# Patient Record
Sex: Male | Born: 1995
Health system: Southern US, Community
[De-identification: ages and names within clinical notes are randomized; demographics above are authoritative.]

## PROBLEM LIST (undated history)

## (undated) DIAGNOSIS — E669 Obesity, unspecified: Secondary | ICD-10-CM

## (undated) DIAGNOSIS — K219 Gastro-esophageal reflux disease without esophagitis: Secondary | ICD-10-CM

## (undated) DIAGNOSIS — T7840XA Allergy, unspecified, initial encounter: Secondary | ICD-10-CM

## (undated) DIAGNOSIS — F909 Attention-deficit hyperactivity disorder, unspecified type: Secondary | ICD-10-CM

## (undated) HISTORY — DX: Allergy, unspecified, initial encounter: T78.40XA

## (undated) HISTORY — DX: Obesity, unspecified: E66.9

## (undated) HISTORY — DX: Attention-deficit hyperactivity disorder, unspecified type: F90.9

---
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
## 2016-01-28 ENCOUNTER — Ambulatory Visit (INDEPENDENT_AMBULATORY_CARE_PROVIDER_SITE_OTHER): Payer: BLUE CROSS/BLUE SHIELD | Admitting: Family Medicine

## 2016-01-28 ENCOUNTER — Encounter: Payer: Self-pay | Admitting: Family Medicine

## 2016-01-28 VITALS — BP 140/100 | HR 80 | Temp 98.2°F | Resp 16 | Wt 220.0 lb

## 2016-01-28 DIAGNOSIS — J069 Acute upper respiratory infection, unspecified: Secondary | ICD-10-CM | POA: Diagnosis not present

## 2016-01-28 DIAGNOSIS — J4521 Mild intermittent asthma with (acute) exacerbation: Secondary | ICD-10-CM | POA: Diagnosis not present

## 2016-01-28 MED ORDER — PREDNISONE 10 MG (21) PO TBPK: 10.0000 mg | ORAL_TABLET | Freq: Every day | ORAL | 0 refills | Status: DC

## 2016-01-28 MED ORDER — ALBUTEROL SULFATE (2.5 MG/3ML) 0.083% IN NEBU
2.5000 mg | INHALATION_SOLUTION | Freq: Once | RESPIRATORY_TRACT | Status: AC
  Administered 2016-01-28: 2.5 mg via RESPIRATORY_TRACT

## 2016-01-28 NOTE — Patient Instructions (Addendum)
Start the steroid dose pak and use albuterol as prescribed. Call if you are not improving or if you get worse or spike a fever.

## 2016-01-28 NOTE — Progress Notes (Signed)
   Subjective:    Patient ID: Daniel Church, male    DOB: 03-01-1995, 20 y.o.   MRN: [id number]  HPI Chief Complaint  Patient presents with  . sick    sick- coughing chest congestion. thrown up 3 times yesterday. using neb every 6 hours and used inhaler about 10 times yesterday.   He is a [age] year male with a history of mild intermittent asthma who is here with complaints of a 2 day history of rhinorrhea, nasal congestion, coughing, wheezing and chest tightness. States he used his nebulizer 4 times yesterday with the last time at 3am. Reports he used his proair approximately 10 times. States he is at least 40% improved today compared to yesterday. States he has a peak flow meter at home but did not use it. States his asthma is normally well controlled.  States he vomited 3 times after coughing and states just mucous came up.   Reports having asthma since childhood. States his asthma triggers are usually with exercise or URI. No history of ED visits for asthma since childhood and no hospitalizations. Uses inhaler prior to band practice when he is going to be out in the cold for hours.   Denies fever, chills, ear pain, sore throat, abdominal pain, nausea, vomiting, diarrhea.   Reviewed allergies, medications, past medical, and social history.    Review of Systems Pertinent positives and negatives in the history of present illness.     Objective:   Physical Exam BP (!) 140/100   Pulse 80   Temp 98.2 F (36.8 C) (Oral)   Resp 16   Wt 220 lb (99.8 kg)   SpO2 98%   BMI 34.46 kg/m  Alert and in no distress. No sinus tenderness. Nares with erythema, edema and clear drainage. Tympanic membranes and canals are normal. Pharyngeal area is normal. Neck is supple without adenopathy or thyromegaly. Cardiac exam shows a regular sinus rhythm without murmurs or gallops. Lungs with expiratory wheezing throughout prior to breathing treatment. No accessory muscle use or tachypnea.  Post  breathing treatment: Left lobes with mild expiratory wheezing, right lobes with mild to moderate expiratory wheezing. Some relief reported per patient.  Peak flow improved slightly after breathing treatment from 320 to 330.       Assessment & Plan:  Exacerbation of intermittent asthma, unspecified asthma severity  Acute URI  Discussed that his asthma appears well controlled and that he does have an acute URI that seems to be flaring his asthma. Plan to treat with course of steroids. Discussed that he appears to have a viral URI and does not need antibiotics at this time. Reeducated patient on peak flow meter being aware of asthma triggers. He is quite knowledgeable about his illness. He will follow-up if he worsens or is not back to baseline after completing the course of steroids.

## 2016-12-09 ENCOUNTER — Observation Stay (HOSPITAL_BASED_OUTPATIENT_CLINIC_OR_DEPARTMENT_OTHER): Payer: BLUE CROSS/BLUE SHIELD

## 2016-12-09 ENCOUNTER — Encounter (HOSPITAL_COMMUNITY): Payer: Self-pay | Admitting: Emergency Medicine

## 2016-12-09 ENCOUNTER — Emergency Department (HOSPITAL_COMMUNITY): Payer: BLUE CROSS/BLUE SHIELD

## 2016-12-09 ENCOUNTER — Inpatient Hospital Stay (HOSPITAL_COMMUNITY)
Admission: EM | Admit: 2016-12-09 | Discharge: 2016-12-11 | DRG: 203 | Disposition: A | Discharge status: Home / Self Care | Payer: BLUE CROSS/BLUE SHIELD | Attending: Internal Medicine | Admitting: Internal Medicine

## 2016-12-09 ENCOUNTER — Encounter (HOSPITAL_COMMUNITY): Payer: Self-pay

## 2016-12-09 ENCOUNTER — Emergency Department (HOSPITAL_COMMUNITY)
Admission: EM | Admit: 2016-12-09 | Discharge: 2016-12-09 | Disposition: A | Discharge status: Home / Self Care | Payer: BLUE CROSS/BLUE SHIELD | Source: Home / Self Care | Attending: Emergency Medicine | Admitting: Emergency Medicine

## 2016-12-09 DIAGNOSIS — B349 Viral infection, unspecified: Secondary | ICD-10-CM | POA: Diagnosis present

## 2016-12-09 DIAGNOSIS — E669 Obesity, unspecified: Secondary | ICD-10-CM | POA: Diagnosis present

## 2016-12-09 DIAGNOSIS — J4541 Moderate persistent asthma with (acute) exacerbation: Secondary | ICD-10-CM | POA: Insufficient documentation

## 2016-12-09 DIAGNOSIS — F909 Attention-deficit hyperactivity disorder, unspecified type: Secondary | ICD-10-CM | POA: Diagnosis present

## 2016-12-09 DIAGNOSIS — I158 Other secondary hypertension: Secondary | ICD-10-CM

## 2016-12-09 DIAGNOSIS — J45901 Unspecified asthma with (acute) exacerbation: Secondary | ICD-10-CM | POA: Diagnosis not present

## 2016-12-09 DIAGNOSIS — R9431 Abnormal electrocardiogram [ECG] [EKG]: Secondary | ICD-10-CM | POA: Diagnosis present

## 2016-12-09 DIAGNOSIS — Z720 Tobacco use: Secondary | ICD-10-CM | POA: Diagnosis present

## 2016-12-09 DIAGNOSIS — J4551 Severe persistent asthma with (acute) exacerbation: Secondary | ICD-10-CM

## 2016-12-09 DIAGNOSIS — Z79899 Other long term (current) drug therapy: Secondary | ICD-10-CM

## 2016-12-09 DIAGNOSIS — R0602 Shortness of breath: Secondary | ICD-10-CM | POA: Diagnosis not present

## 2016-12-09 DIAGNOSIS — R000 Tachycardia, unspecified: Secondary | ICD-10-CM

## 2016-12-09 LAB — ECHOCARDIOGRAM COMPLETE
AVLVOTPG: 5 mmHg
FS: 29 % (ref 28–44)
HEIGHTINCHES: 67 in
IVS/LV PW RATIO, ED: 1.16
LA ID, A-P, ES: 26 mm
LA diam end sys: 26 mm
LA diam index: 1.22 cm/m2
LA vol A4C: 20.3 ml
LA vol index: 13.1 mL/m2
LAVOL: 27.8 mL
LV e' LATERAL: 17.7 cm/s
LVOT VTI: 19.2 cm
LVOT area: 3.8 cm2
LVOTD: 22 mm
LVOTPV: 117 cm/s
LVOTSV: 73 mL
Lateral S' vel: 18.8 cm/s
PW: 8.23 mm — AB (ref 0.6–1.1)
RV TAPSE: 22.8 mm
TDI e' lateral: 17.7
TDI e' medial: 11.7
WEIGHTICAEL: 3600 [oz_av]

## 2016-12-09 LAB — D-DIMER, QUANTITATIVE (NOT AT ARMC): D DIMER QUANT: 0.29 ug{FEU}/mL (ref 0.00–0.50)

## 2016-12-09 LAB — BASIC METABOLIC PANEL
ANION GAP: 14 (ref 5–15)
BUN: 8 mg/dL (ref 6–20)
CHLORIDE: 104 mmol/L (ref 101–111)
CO2: 20 mmol/L — ABNORMAL LOW (ref 22–32)
Calcium: 9.8 mg/dL (ref 8.9–10.3)
Creatinine, Ser: 1.18 mg/dL (ref 0.61–1.24)
Glucose, Bld: 112 mg/dL — ABNORMAL HIGH (ref 65–99)
POTASSIUM: 3.6 mmol/L (ref 3.5–5.1)
SODIUM: 138 mmol/L (ref 135–145)

## 2016-12-09 LAB — CBC WITH DIFFERENTIAL/PLATELET
BASOS ABS: 0 10*3/uL (ref 0.0–0.1)
Basophils Relative: 0 %
EOS ABS: 0.2 10*3/uL (ref 0.0–0.7)
EOS PCT: 2 %
HCT: 48.1 % (ref 39.0–52.0)
HEMOGLOBIN: 16.9 g/dL (ref 13.0–17.0)
LYMPHS ABS: 0.9 10*3/uL (ref 0.7–4.0)
Lymphocytes Relative: 8 %
MCH: 31.5 pg (ref 26.0–34.0)
MCHC: 35.1 g/dL (ref 30.0–36.0)
MCV: 89.7 fL (ref 78.0–100.0)
Monocytes Absolute: 1.4 10*3/uL — ABNORMAL HIGH (ref 0.1–1.0)
Monocytes Relative: 12 %
NEUTROS PCT: 78 %
Neutro Abs: 9.4 10*3/uL — ABNORMAL HIGH (ref 1.7–7.7)
PLATELETS: 354 10*3/uL (ref 150–400)
RBC: 5.36 MIL/uL (ref 4.22–5.81)
RDW: 12.6 % (ref 11.5–15.5)
WBC: 11.8 10*3/uL — AB (ref 4.0–10.5)

## 2016-12-09 LAB — I-STAT TROPONIN, ED: TROPONIN I, POC: 0 ng/mL (ref 0.00–0.08)

## 2016-12-09 LAB — TROPONIN I

## 2016-12-09 MED ORDER — ONDANSETRON HCL 4 MG/2ML IJ SOLN: 4.0000 mg | Freq: Four times a day (QID) | INTRAMUSCULAR | Status: DC | PRN

## 2016-12-09 MED ORDER — LEVALBUTEROL HCL 1.25 MG/0.5ML IN NEBU
1.2500 mg | INHALATION_SOLUTION | Freq: Four times a day (QID) | RESPIRATORY_TRACT | Status: DC
  Administered 2016-12-10 (×3): 1.25 mg via RESPIRATORY_TRACT
  Filled 2016-12-09 (×3): qty 0.5

## 2016-12-09 MED ORDER — ALBUTEROL SULFATE HFA 108 (90 BASE) MCG/ACT IN AERS: 1.0000 | INHALATION_SPRAY | Freq: Four times a day (QID) | RESPIRATORY_TRACT | 1 refills | Status: DC | PRN

## 2016-12-09 MED ORDER — ACETAMINOPHEN 325 MG PO TABS
650.0000 mg | ORAL_TABLET | Freq: Four times a day (QID) | ORAL | Status: DC | PRN
  Administered 2016-12-09 – 2016-12-10 (×2): 650 mg via ORAL
  Filled 2016-12-09 (×2): qty 2

## 2016-12-09 MED ORDER — IPRATROPIUM-ALBUTEROL 0.5-2.5 (3) MG/3ML IN SOLN
3.0000 mL | Freq: Four times a day (QID) | RESPIRATORY_TRACT | Status: DC
Start: 2016-12-09 — End: 2016-12-09

## 2016-12-09 MED ORDER — METHYLPREDNISOLONE SODIUM SUCC 125 MG IJ SOLR
60.0000 mg | Freq: Four times a day (QID) | INTRAMUSCULAR | Status: DC
  Administered 2016-12-09 – 2016-12-11 (×8): 60 mg via INTRAVENOUS
  Filled 2016-12-09 (×8): qty 2

## 2016-12-09 MED ORDER — ALPRAZOLAM 0.25 MG PO TABS
0.2500 mg | ORAL_TABLET | Freq: Three times a day (TID) | ORAL | Status: DC | PRN
  Administered 2016-12-10: 0.25 mg via ORAL
  Filled 2016-12-09: qty 1

## 2016-12-09 MED ORDER — ONDANSETRON HCL 4 MG PO TABS: 4.0000 mg | ORAL_TABLET | Freq: Four times a day (QID) | ORAL | Status: DC | PRN

## 2016-12-09 MED ORDER — ACETAMINOPHEN 650 MG RE SUPP: 650.0000 mg | Freq: Four times a day (QID) | RECTAL | Status: DC | PRN

## 2016-12-09 MED ORDER — SODIUM CHLORIDE 0.9% FLUSH
3.0000 mL | Freq: Two times a day (BID) | INTRAVENOUS | Status: DC
  Administered 2016-12-09 – 2016-12-11 (×3): 3 mL via INTRAVENOUS

## 2016-12-09 MED ORDER — LEVALBUTEROL HCL 1.25 MG/0.5ML IN NEBU
1.2500 mg | INHALATION_SOLUTION | Freq: Four times a day (QID) | RESPIRATORY_TRACT | Status: DC
  Administered 2016-12-09 (×2): 1.25 mg via RESPIRATORY_TRACT
  Filled 2016-12-09 (×4): qty 0.5

## 2016-12-09 MED ORDER — HYDRALAZINE HCL 20 MG/ML IJ SOLN
5.0000 mg | INTRAMUSCULAR | Status: DC | PRN
  Filled 2016-12-09: qty 1

## 2016-12-09 MED ORDER — MAGNESIUM SULFATE 2 GM/50ML IV SOLN
2.0000 g | Freq: Once | INTRAVENOUS | Status: AC
  Administered 2016-12-09: 2 g via INTRAVENOUS
  Filled 2016-12-09: qty 50

## 2016-12-09 MED ORDER — ALBUTEROL SULFATE (2.5 MG/3ML) 0.083% IN NEBU: 2.5000 mg | INHALATION_SOLUTION | RESPIRATORY_TRACT | 0 refills | Status: DC | PRN

## 2016-12-09 MED ORDER — METHYLPREDNISOLONE SODIUM SUCC 125 MG IJ SOLR
125.0000 mg | Freq: Once | INTRAMUSCULAR | Status: AC
  Administered 2016-12-09: 125 mg via INTRAVENOUS
  Filled 2016-12-09: qty 2

## 2016-12-09 MED ORDER — ALBUTEROL SULFATE (2.5 MG/3ML) 0.083% IN NEBU: 5.0000 mg | INHALATION_SOLUTION | Freq: Once | RESPIRATORY_TRACT | Status: AC

## 2016-12-09 MED ORDER — IPRATROPIUM-ALBUTEROL 0.5-2.5 (3) MG/3ML IN SOLN
3.0000 mL | Freq: Once | RESPIRATORY_TRACT | Status: AC
  Administered 2016-12-09: 3 mL via RESPIRATORY_TRACT
  Filled 2016-12-09: qty 3

## 2016-12-09 MED ORDER — PREDNISONE 20 MG PO TABS: 60.0000 mg | ORAL_TABLET | Freq: Once | ORAL | Status: DC

## 2016-12-09 MED ORDER — IPRATROPIUM BROMIDE 0.02 % IN SOLN
0.5000 mg | Freq: Once | RESPIRATORY_TRACT | Status: AC
  Administered 2016-12-09: 0.5 mg via RESPIRATORY_TRACT

## 2016-12-09 MED ORDER — LORAZEPAM 2 MG/ML IJ SOLN
1.0000 mg | Freq: Once | INTRAMUSCULAR | Status: AC
  Administered 2016-12-09: 1 mg via INTRAVENOUS
  Filled 2016-12-09: qty 1

## 2016-12-09 MED ORDER — ENOXAPARIN SODIUM 40 MG/0.4ML ~~LOC~~ SOLN
40.0000 mg | SUBCUTANEOUS | Status: DC
  Administered 2016-12-09 – 2016-12-10 (×2): 40 mg via SUBCUTANEOUS
  Filled 2016-12-09 (×2): qty 0.4

## 2016-12-09 MED ORDER — SODIUM CHLORIDE 0.9 % IV SOLN
INTRAVENOUS | Status: DC
  Administered 2016-12-09 – 2016-12-10 (×3): via INTRAVENOUS

## 2016-12-09 MED ORDER — ALBUTEROL (5 MG/ML) CONTINUOUS INHALATION SOLN
15.0000 mg | INHALATION_SOLUTION | RESPIRATORY_TRACT | Status: DC
  Administered 2016-12-09: 15 mg via RESPIRATORY_TRACT

## 2016-12-09 MED ORDER — IPRATROPIUM-ALBUTEROL 0.5-2.5 (3) MG/3ML IN SOLN
3.0000 mL | RESPIRATORY_TRACT | Status: DC | PRN
  Administered 2016-12-11 (×2): 3 mL via RESPIRATORY_TRACT
  Filled 2016-12-09 (×3): qty 3

## 2016-12-09 MED ORDER — PREDNISONE 50 MG PO TABS: ORAL_TABLET | ORAL | 0 refills | Status: DC

## 2016-12-09 MED ORDER — METOPROLOL TARTRATE 5 MG/5ML IV SOLN: 2.5000 mg | INTRAVENOUS | Status: DC | PRN

## 2016-12-09 MED ORDER — ALBUTEROL SULFATE (2.5 MG/3ML) 0.083% IN NEBU
INHALATION_SOLUTION | RESPIRATORY_TRACT | Status: AC
  Filled 2016-12-09: qty 3

## 2016-12-09 NOTE — ED Triage Notes (Signed)
Pt having sob, Hx of asthma not getting relief with rescue inhaler at home for the past hour.

## 2016-12-09 NOTE — ED Provider Notes (Signed)
[facility] EMERGENCY DEPARTMENT Provider Note   CSN: [id number] Arrival date & time: 12/09/16  7829     History   Chief Complaint Chief Complaint  Patient presents with  . Shortness of Breath    HPI Daniel Church is a 21 y.o. male.  The history is provided by the patient and a parent.  Shortness of Breath  This is a new problem. The problem occurs frequently.The current episode started yesterday. The problem has been gradually worsening. Associated symptoms include cough, sputum production and wheezing. Pertinent negatives include no fever, no hemoptysis, no vomiting, no leg pain and no leg swelling. Associated symptoms comments: Chest tightness . Associated medical issues include asthma.   Patient with h/o asthma, presents with cough/shortness of breath/wheezing over past 24 hrs that is worsening Similar to prior asthma exacerbation though it has been several yrs since last attack He reports chest tightness and difficulty breathing He reports he is a smoker Past Medical History:  Diagnosis Date  . ADHD (attention deficit hyperactivity disorder)   . Allergy   . Asthma   . Obesity     Patient Active Problem List   Diagnosis Date Noted  . Vision changes 02/27/2011  . Obesity 02/27/2011  . Asthma 11/21/2010  . ADHD (attention deficit hyperactivity disorder) 11/21/2010  . Obesity (BMI 30-39.9) 11/21/2010    History reviewed. No pertinent surgical history.     Home Medications    Prior to Admission medications   Medication Sig Start Date End Date Taking? Authorizing Provider  fexofenadine (ALLEGRA) 30 MG tablet Take 30 mg by mouth 2 (two) times daily.    [provider]  predniSONE (STERAPRED UNI-PAK 21 TAB) 10 MG (21) TBPK tablet Take 1 tablet (10 mg total) by mouth daily. Tapered dose as per manufacturers recommendation. 01/28/16   Avanell Shackleton, NP-C  PROAIR HFA 108 [phone] Base) MCG/ACT inhaler inhale 2 puffs by mouth every 6  hours if needed for cough wheezing 09/24/15   Ronnald Nian, MD    Family History Family History  Problem Relation Age of Onset  . Hypertension Mother   . Diabetes Father   . Heart disease Father        CHF  . Irritable bowel syndrome Father   . Hyperlipidemia Father   . Hypertension Father   . Heart disease Maternal Uncle 47       MI  . Bipolar disorder Maternal Uncle   . Heart disease Maternal Uncle 51       MI  . Cancer Neg Hx     Social History Social History  Substance Use Topics  . Smoking status: Never Smoker  . Smokeless tobacco: Never Used  . Alcohol use No     Allergies   Patient has no known allergies.   Review of Systems Review of Systems  Constitutional: Negative for fever.  Respiratory: Positive for cough, sputum production, shortness of breath and wheezing. Negative for hemoptysis.   Cardiovascular: Negative for leg swelling.  Gastrointestinal: Negative for vomiting.  All other systems reviewed and are negative.    Physical Exam Updated Vital Signs Ht 1.702 m (5\' 7" )   Wt 102.1 kg (225 lb)   SpO2 96%   BMI 35.24 kg/m   Physical Exam CONSTITUTIONAL: Well developed/well nourished HEAD: Normocephalic/atraumatic EYES: EOMI/PERRL ENMT: Mucous membranes moist, uvula midline, no erythema/exudates NECK: supple no meningeal signs SPINE/BACK:entire spine nontender CV: S1/S2 noted, no murmurs/rubs/gallops noted LUNGS: tachypnea, wheezing bilaterally ABDOMEN: soft, nontender, no  rebound or guarding, bowel sounds noted throughout abdomen GU:no cva tenderness NEURO: Pt is awake/alert/appropriate, moves all extremitiesx4.  No facial droop.   EXTREMITIES: pulses normal/equal, full ROM, no LE edema/tenderness SKIN: warm, color normal PSYCH: no abnormalities of mood noted, alert and oriented to situation   ED Treatments / Results  Labs (all labs ordered are listed, but only abnormal results are displayed) Labs Reviewed  CBC WITH  DIFFERENTIAL/PLATELET - Abnormal; Notable for the following:       Result Value   WBC 11.8 (*)    Neutro Abs 9.4 (*)    Monocytes Absolute 1.4 (*)    All other components within normal limits  BASIC METABOLIC PANEL - Abnormal; Notable for the following:    CO2 20 (*)    Glucose, Bld 112 (*)    All other components within normal limits  I-STAT TROPONIN, ED    EKG  EKG Interpretation  Date/Time:  Wednesday December 09 2016 04:41:01 EDT Ventricular Rate:  101 PR Interval:    QRS Duration: 82 QT Interval:  348 QTC Calculation: 452 R Axis:   73 Text Interpretation:  Sinus tachycardia Probable left atrial enlargement Baseline wander in lead(s) I aVR V1 V2 Confirmed by Zadie Rhine ([id number]) on 12/09/2016 4:49:43 AM       Radiology Dg Chest Portable 1 View  Result Date: 12/09/2016 CLINICAL DATA:  Asthma and shortness of breath EXAM: PORTABLE CHEST 1 VIEW COMPARISON:  Chest radiograph 11/29/2006 FINDINGS: The heart size and mediastinal contours are within normal limits. Both lungs are clear. The visualized skeletal structures are unremarkable. IMPRESSION: Clear lungs. Electronically Signed   By: Deatra Robinson M.D.   On: 12/09/2016 05:13    Procedures Procedures (including critical care time)  Medications Ordered in ED Medications  albuterol (PROVENTIL,VENTOLIN) solution continuous neb (0 mg Nebulization Stopped 12/09/16 0629)  albuterol (PROVENTIL) (2.5 MG/3ML) 0.083% nebulizer solution 5 mg (0 mg Nebulization Return to [facility] 12/09/16 0455)  albuterol (PROVENTIL) (2.5 MG/3ML) 0.083% nebulizer solution (  Return to [facility] 12/09/16 0455)  ipratropium (ATROVENT) nebulizer solution 0.5 mg (0.5 mg Nebulization Given 12/09/16 0455)  methylPREDNISolone sodium succinate (SOLU-MEDROL) 125 mg/2 mL injection 125 mg (125 mg Intravenous Given 12/09/16 0523)     Initial Impression / Assessment and Plan / ED Course  I have reviewed the triage vital signs and the nursing  notes.  Pertinent labs & imaging results that were available during my care of the patient were reviewed by me and considered in my medical decision making (see chart for details).     Pt in the ED for asthma exacerbation After nebs treatments he is improved He still has wheeze but much improved He is able to ambulate and no hypoxia He feels jittery but no CP Overall well appearing/improved Suspect viral URI that triggered asthma exacerbation Advised to quit smoking We discussed strict ER return precautions   I doubt ACS/PE/CHF as cause of shortness of breath   Final Clinical Impressions(s) / ED Diagnoses   Final diagnoses:  Moderate persistent asthma with exacerbation    New Prescriptions New Prescriptions   ALBUTEROL (PROVENTIL HFA;VENTOLIN HFA) 108 (90 BASE) MCG/ACT INHALER    Inhale 1-2 puffs into the lungs every 6 (six) hours as needed for wheezing or shortness of breath.   ALBUTEROL (PROVENTIL) (2.5 MG/3ML) 0.083% NEBULIZER SOLUTION    Take 3 mLs (2.5 mg total) by nebulization every 4 (four) hours as needed for wheezing or shortness of breath.   PREDNISONE (DELTASONE) 50 MG TABLET  One tablet PO daily for 4 days     Zadie RhineWickline, Climmie Cronce, MD 12/09/16 [id number]

## 2016-12-09 NOTE — Progress Notes (Signed)
Pt given a Douneb at 1056, peak flow pre & post neb treatment ordered at 11:57. Next neb treatment scheduled for 1700.  Peak flow meter given to nursing.  MD notified.

## 2016-12-09 NOTE — ED Provider Notes (Signed)
[facility]ERGENCY [facility] Provider Note   CSN: [id number] Arrival date & time: 12/09/16  [id number]     History   Chief Complaint No chief complaint on file.   HPI Daniel Church is a 21 y.o. male.  The history is provided by the patient. No language interpreter was used.  Shortness of Breath  This is a new problem. The current episode started 12 to 24 hours ago. The problem has been gradually worsening. Associated symptoms include cough and wheezing. Risk factors include smoking. The treatment provided mild relief. He has had prior hospitalizations. He has had prior ED visits. He has had no prior ICU admissions. Associated medical issues include asthma.  Pt was here earlier today and had solumedrol and continuous neb. Pt reports he became short of breath again after leaving.   Past Medical History:  Diagnosis Date  . ADHD (attention deficit hyperactivity disorder)   . Allergy   . Asthma   . Obesity     Patient Active Problem List   Diagnosis Date Noted  . Vision changes 02/27/2011  . Obesity 02/27/2011  . Asthma 11/21/2010  . ADHD (attention deficit hyperactivity disorder) 11/21/2010  . Obesity (BMI 30-39.9) 11/21/2010    History reviewed. No pertinent surgical history.     Home Medications    Prior to Admission medications   Medication Sig Start Date End Date Taking? Authorizing Provider  albuterol (PROVENTIL HFA;VENTOLIN HFA) 108 (90 Base) MCG/ACT inhaler Inhale 1-2 puffs into the lungs every 6 (six) hours as needed for wheezing or shortness of breath. 12/09/16   Zadie RhineWickline, Donald, MD  albuterol (PROVENTIL) (2.5 MG/3ML) 0.083% nebulizer solution Take 3 mLs (2.5 mg total) by nebulization every 4 (four) hours as needed for wheezing or shortness of breath. 12/09/16   Zadie RhineWickline, Donald, MD  fexofenadine (ALLEGRA) 30 MG tablet Take 30 mg by mouth 2 (two) times daily.    [provider]  predniSONE (DELTASONE) 50 MG tablet One tablet PO  daily for 4 days 12/09/16   Zadie RhineWickline, Donald, MD    Family History Family History  Problem Relation Age of Onset  . Hypertension Mother   . Diabetes Father   . Heart disease Father        CHF  . Irritable bowel syndrome Father   . Hyperlipidemia Father   . Hypertension Father   . Heart disease Maternal Uncle 47       MI  . Bipolar disorder Maternal Uncle   . Heart disease Maternal Uncle 51       MI  . Cancer Neg Hx     Social History Social History  Substance Use Topics  . Smoking status: Never Smoker  . Smokeless tobacco: Never Used  . Alcohol use No     Allergies   Patient has no known allergies.   Review of Systems Review of Systems  Respiratory: Positive for cough, shortness of breath and wheezing.   All other systems reviewed and are negative.    Physical Exam Updated Vital Signs BP 130/80 (BP Location: Right Arm)   Pulse (!) 111   Temp 100.2 F (37.9 C) (Oral)   Resp (!) 22   SpO2 96%   Physical Exam  Constitutional: He appears well-developed and well-nourished.  HENT:  Head: Normocephalic and atraumatic.  Right Ear: External ear normal.  Left Ear: External ear normal.  Eyes: Conjunctivae are normal.  Neck: Neck supple.  Cardiovascular: Normal rate and regular rhythm.   No murmur heard. Pulmonary/Chest:  Effort normal. No respiratory distress. He has wheezes.  Abdominal: Soft. There is no tenderness.  Musculoskeletal: Normal range of motion. He exhibits no edema.  Neurological: He is alert.  Skin: Skin is warm and dry.  Psychiatric: He has a normal mood and affect.  Nursing note and vitals reviewed.    ED Treatments / Results  Labs (all labs ordered are listed, but only abnormal results are displayed) Labs Reviewed  TROPONIN I  D-DIMER, QUANTITATIVE (NOT AT [facility])    EKG  EKG Interpretation  Date/Time:  Wednesday December 09 2016 09:55:27 EDT Ventricular Rate:  103 PR Interval:    QRS Duration: 88 QT Interval:  356 QTC  Calculation: 466 R Axis:   74 Text Interpretation:  Sinus tachycardia Probable left atrial enlargement Repol abnrm, global ischemia, diffuse leads T wave inversions diffusely.  No STEMI Confirmed by Theda Belfast ([id number]) on 12/09/2016 10:00:13 AM       Radiology Dg Chest Portable 1 View  Result Date: 12/09/2016 CLINICAL DATA:  Asthma and shortness of breath EXAM: PORTABLE CHEST 1 VIEW COMPARISON:  Chest radiograph 11/29/2006 FINDINGS: The heart size and mediastinal contours are within normal limits. Both lungs are clear. The visualized skeletal structures are unremarkable. IMPRESSION: Clear lungs. Electronically Signed   By: Deatra Robinson M.D.   On: 12/09/2016 05:13    Procedures Procedures (including critical care time)  Medications Ordered in ED Medications  magnesium sulfate IVPB 2 g 50 mL (2 g Intravenous New Bag/Given 12/09/16 1039)  LORazepam (ATIVAN) injection 1 mg (not administered)  ipratropium-albuterol (DUONEB) 0.5-2.5 (3) MG/3ML nebulizer solution 3 mL (not administered)     Initial Impression / Assessment and Plan / ED Course  I have reviewed the triage vital signs and the nursing notes.  Pertinent labs & imaging results that were available during my care of the patient were reviewed by me and considered in my medical decision making (see chart for details).     Pt complains of chest feeling tight.  Pt states he feels saky from all the treatemnts.  Pt given ativan 1 mg.  Magnesium started.  I will repeat duoneb.   Repeat troponin and ddimer ordered.   Final Clinical Impressions(s) / ED Diagnoses   Final diagnoses:  Severe persistent asthma with acute exacerbation  Viral illness    New Prescriptions New Prescriptions   No medications on file   I discussed with [name] on call for [facility].  [facility] will admit for observation and continued treatment.    Elson Areas, [place] 12/09/16 1217    Tegeler, Canary Brim, MD 12/09/16 2130

## 2016-12-09 NOTE — Progress Notes (Signed)
  Echocardiogram 2D Echocardiogram has been performed.  Daniel Church, Daniel Church 12/09/2016, 5:11 PM

## 2016-12-09 NOTE — ED Triage Notes (Signed)
Patient just discharged from ED for asthma exacerbation. States that he had an hour continuous neb and solumedrol and once he got home had the chest tightness and wheezing return. No acute distress

## 2016-12-09 NOTE — ED Notes (Signed)
Pt ambulated with pulse ox, O2 sats remained mid 90's, HR 120's. Pt states he does not feel as SOB, just feels "jittery" from the medicine. EDP aware

## 2016-12-09 NOTE — H&P (Signed)
History and Physical    Daniel Church RUE:[id number] DOB: [birth year] DOA: 12/09/2016   PCP: Ronnald Nian, MD   Attending physician: Konrad Dolores  Patient coming from/Resides with: Private residence  Chief Complaint: Shortness of breath and wheezing  HPI: Daniel Church is a 21 y.o. male with medical history significant for asthma, obesity and ongoing tobacco abuse initially presented to the ER early this a.m. at 430 with similar symptoms.  Initial lab evaluation and chest x-ray unremarkable.  Was given multiple nebulizer treatments as well as IV Solu-Medrol with improvement in symptoms and patient was subsequently discharged home.  He returns back to the ER for recurrence of chest tightness and wheezing.  Because of ongoing chest tightness and history of tobacco abuse an EKG was obtained that showed nonspecific downsloping of ST segments in multiple leads, nonspecific with no old EKG for comparison.  Troponin negative.  Patient continues to have wheezing and chest tightness but is not hypoxemic at rest.  Because of failed outpatient therapies patient will be placed in the hospital for more aggressive management of his asthma exacerbation.  ED Course:  Vital Signs: BP (!) 165/79   Pulse (!) 107   Temp 100.2 F (37.9 C) (Oral)   Resp 18   SpO2 96%  Chest x-ray: As above Lab data: (From visit earlier this a.m.) sodium 138, potassium 3.6, chloride 104, CO2 20, glucose 112, BUN 8, creatinine 1.18, anion gap 14, troponin <0.03, white count 11,800 with neutrophils 78% and absolute neutrophils 9.4%, hemoglobin 16.9, platelets 354,000 d-dimer 0.29 Medications and treatments: Magnesium 2 g IV x1, Ativan 1 mg IV x1, DuoNeb x1  Review of Systems:  In addition to the HPI above,  No Fever-chills, myalgias or other constitutional symptoms No Headache, changes with Vision or hearing, new weakness, tingling, numbness in any extremity, dizziness, dysarthria or word finding difficulty,  gait disturbance or imbalance, tremors or seizure activity No problems swallowing food or Liquids, indigestion/reflux, choking or coughing while eating, abdominal pain with or after eating No Chest pain; reported mild cough on Saturday, no palpitations, orthopnea  No Abdominal pain, N/V, melena,hematochezia, dark tarry stools, constipation No dysuria, malodorous urine, hematuria or flank pain No new skin rashes, lesions, masses or bruises, No new joint pains, aches, swelling or redness No recent unintentional weight gain or loss No polyuria, polydypsia or polyphagia   Past Medical History:  Diagnosis Date  . ADHD (attention deficit hyperactivity disorder)   . Allergy   . Asthma   . Obesity     History reviewed. No pertinent surgical history.  Social History   Social History  . Marital status: Single    Spouse name: N/A  . Number of children: N/A  . Years of education: N/A   Occupational History  . Not on file.   Social History Main Topics  . Smoking status: Never Smoker  . Smokeless tobacco: Never Used  . Alcohol use No  . Drug use: No  . Sexual activity: Not Currently   Other Topics Concern  . Not on file   Social History Narrative   Lives with parents.  1 dog. No tobacco exposure.  Has 2 older brothers    Mobility: Independent Work history: Works at [employer]   No Known Allergies  Family History  Problem Relation Age of Onset  . Hypertension Mother   . Diabetes Father   . Heart disease Father        CHF  .  Irritable bowel syndrome Father   . Hyperlipidemia Father   . Hypertension Father   . Heart disease Maternal Uncle 47       MI  . Bipolar disorder Maternal Uncle   . Heart disease Maternal Uncle 51       MI  . Cancer Neg Hx      Prior to Admission medications   Medication Sig Start Date End Date Taking? Authorizing Provider  albuterol (PROVENTIL HFA;VENTOLIN HFA) 108 (90 Base) MCG/ACT inhaler Inhale 1-2 puffs into the lungs every 6 (six)  hours as needed for wheezing or shortness of breath. 12/09/16  Yes Zadie Rhine, MD  albuterol (PROVENTIL) (2.5 MG/3ML) 0.083% nebulizer solution Take 3 mLs (2.5 mg total) by nebulization every 4 (four) hours as needed for wheezing or shortness of breath. 12/09/16  Yes Zadie Rhine, MD  guaiFENesin (MUCINEX) 600 MG 12 hr tablet Take 600 mg by mouth 2 (two) times daily as needed for cough.   Yes [provider]  predniSONE (DELTASONE) 50 MG tablet One tablet PO daily for 4 days Patient not taking: Reported on 12/09/2016 12/09/16   Zadie Rhine, MD    Physical Exam: Vitals:   12/09/16 0934 12/09/16 1200  BP: 130/80 (!) 165/79  Pulse: (!) 111 (!) 107  Resp: (!) 22 18  Temp: 100.2 F (37.9 C)   TempSrc: Oral   SpO2: 96% 96%      Constitutional: NAD, calm, comfortable Eyes: PERRL, lids and conjunctivae normal, wears glasses ENMT: Mucous membranes are dry. Posterior pharynx clear of any exudate or lesions. Normal dentition.  Neck: normal, supple, no masses, no thyromegaly Respiratory: Bilateral expiratory wheezing, no crackles. Normal respiratory effort at rest without accessory muscle use.  Room air Cardiovascular: Regular rate and rhythm, no murmurs / rubs / gallops. No extremity edema. 2+ pedal pulses. No carotid bruits.  Abdomen: no tenderness, no masses palpated. No hepatosplenomegaly. Bowel sounds positive.  Musculoskeletal: no clubbing / cyanosis. No joint deformity upper and lower extremities. Good ROM, no contractures. Normal muscle tone.  Skin: no rashes, lesions, ulcers. No induration Neurologic: CN 2-12 grossly intact. Sensation intact, DTR normal. Strength 5/5 x all 4 extremities.  Psychiatric: Normal judgment and insight. Sleeping but awakens easily and is oriented x 3. Normal mood.    Labs on Admission: I have personally reviewed following labs and imaging studies  CBC:  Recent Labs Lab 12/09/16 0449  WBC 11.8*  NEUTROABS 9.4*  HGB 16.9  HCT  48.1  MCV 89.7  PLT 354   Basic Metabolic Panel:  Recent Labs Lab 12/09/16 0449  NA 138  K 3.6  CL 104  CO2 20*  GLUCOSE 112*  BUN 8  CREATININE 1.18  CALCIUM 9.8   GFR: Estimated Creatinine Clearance: 113.7 mL/min (by C-G formula based on SCr of 1.18 mg/dL). Liver Function Tests: No results for input(s): AST, ALT, ALKPHOS, BILITOT, PROT, ALBUMIN in the last 168 hours. No results for input(s): LIPASE, AMYLASE in the last 168 hours. No results for input(s): AMMONIA in the last 168 hours. Coagulation Profile: No results for input(s): INR, PROTIME in the last 168 hours. Cardiac Enzymes:  Recent Labs Lab 12/09/16 1027  TROPONINI <0.03   BNP (last 3 results) No results for input(s): PROBNP in the last 8760 hours. HbA1C: No results for input(s): HGBA1C in the last 72 hours. CBG: No results for input(s): GLUCAP in the last 168 hours. Lipid Profile: No results for input(s): CHOL, HDL, LDLCALC, TRIG, CHOLHDL, LDLDIRECT in the last 72  hours. Thyroid Function Tests: No results for input(s): TSH, T4TOTAL, FREET4, T3FREE, THYROIDAB in the last 72 hours. Anemia Panel: No results for input(s): VITAMINB12, FOLATE, FERRITIN, TIBC, IRON, RETICCTPCT in the last 72 hours. Urine analysis:    Component Value Date/Time   BILIRUBINUR neg 09/20/2014 1319   PROTEINUR neg 09/20/2014 1319   UROBILINOGEN negative 09/20/2014 1319   NITRITE neg 09/20/2014 1319   LEUKOCYTESUR Negative 09/20/2014 1319   Sepsis Labs: @LABRCNTIP (procalcitonin:4,lacticidven:4) )No results found for this or any previous visit (from the past 240 hour(s)).   Radiological Exams on Admission: Dg Chest Portable 1 View  Result Date: 12/09/2016 CLINICAL DATA:  Asthma and shortness of breath EXAM: PORTABLE CHEST 1 VIEW COMPARISON:  Chest radiograph 11/29/2006 FINDINGS: The heart size and mediastinal contours are within normal limits. Both lungs are clear. The visualized skeletal structures are unremarkable.  IMPRESSION: Clear lungs. Electronically Signed   By: Deatra RobinsonKevin  Herman M.D.   On: 12/09/2016 05:13    EKG: (Independently reviewed) sinus tachycardia with ventricular rate 101 bpm, QTC 452 ms, question of left atrial enlargement, T wave inversion with sagging ST segments in leads I, II, III, aVF, V3, V4, V5, & V6 likely consistent with either underlying mild tachycardia and/or LV strain EKGs for comparison  Assessment/Plan Principal Problem:   Acute asthma exacerbation -Patient presents with progressive worsening of respiratory symptoms beginning Saturday; presented earlier this morning to ER with initial improvement in symptoms but symptoms recurred after discharge from ED noting this is consistent with failed outpatient management of acute asthma exacerbation -Typically has 1-2 exacerbations per year and has been multiple years since inpatient treatment for asthma exacerbation and has never been intubated -Now has low-grade fever which was not present during initial ER visit- suspect viral syndrome noting chest x-ray unremarkable for focal infiltrate  -Respiratory viral panel/influenza PCR-droplet precautions -Supportive care -Peak flow pre-and post neb treatments and prn worsening of respiratory symptoms -Normal saline IV fluid at 125/hr -Oxygen prn O2 sats </=92% -Room air ambulatory pulse oximetry-no resting hypoxemia -IV Solu-Medrol 60 mg IV every 6 hours -Duo nebs-changed to Xopenex if developed significant restlessness or tachycardia -Xanax prn anxiety -Has not received annual flu vaccination and will need to obtain either at discharge her after discharge with PCP  Active Problems:   Tobacco abuse -Counseled regarding cessation especially in the context of asthma    Abnormal EKG -Global ST segment downsloping/sagging with T wave inversion no old EKG for comparison -Not consistent with acute ischemia -Echocardiogram    Obesity (BMI 35.0-39.9 without comorbidity) -Weight strategies  per PCP      DVT prophylaxis: Lovenox Code Status: Full Family Communication: Mother and father at bedside Disposition Plan: Home Consults called: None    ELLIS,ALLISON L. ANP-BC [facility] Pager [phone]   If 7PM-7AM, please contact night-coverage [email or web address] Password [email or web address]  12/09/2016, 12:17 PM

## 2016-12-09 NOTE — ED Notes (Signed)
Attempted to call report will call again in 5 minutes

## 2016-12-09 NOTE — Progress Notes (Signed)
RT obtained peak flow from pt. Pre Xopenex neb treatment, best of 3 attempts was 140L Post neb treatment, best of 3 attempts was 180L.

## 2016-12-10 DIAGNOSIS — R9431 Abnormal electrocardiogram [ECG] [EKG]: Secondary | ICD-10-CM

## 2016-12-10 DIAGNOSIS — Z79899 Other long term (current) drug therapy: Secondary | ICD-10-CM | POA: Diagnosis not present

## 2016-12-10 DIAGNOSIS — J4551 Severe persistent asthma with (acute) exacerbation: Secondary | ICD-10-CM | POA: Diagnosis present

## 2016-12-10 DIAGNOSIS — E669 Obesity, unspecified: Secondary | ICD-10-CM | POA: Diagnosis present

## 2016-12-10 DIAGNOSIS — R0602 Shortness of breath: Secondary | ICD-10-CM | POA: Diagnosis present

## 2016-12-10 DIAGNOSIS — Z720 Tobacco use: Secondary | ICD-10-CM

## 2016-12-10 DIAGNOSIS — J45901 Unspecified asthma with (acute) exacerbation: Secondary | ICD-10-CM

## 2016-12-10 DIAGNOSIS — F909 Attention-deficit hyperactivity disorder, unspecified type: Secondary | ICD-10-CM | POA: Diagnosis present

## 2016-12-10 DIAGNOSIS — B349 Viral infection, unspecified: Secondary | ICD-10-CM | POA: Diagnosis present

## 2016-12-10 LAB — RESPIRATORY PANEL BY PCR
ADENOVIRUS-RVPPCR: NOT DETECTED
Bordetella pertussis: NOT DETECTED
CORONAVIRUS NL63-RVPPCR: NOT DETECTED
CORONAVIRUS OC43-RVPPCR: NOT DETECTED
Chlamydophila pneumoniae: NOT DETECTED
Coronavirus 229E: NOT DETECTED
Coronavirus HKU1: NOT DETECTED
INFLUENZA A-RVPPCR: NOT DETECTED
INFLUENZA B-RVPPCR: NOT DETECTED
MYCOPLASMA PNEUMONIAE-RVPPCR: NOT DETECTED
Metapneumovirus: NOT DETECTED
PARAINFLUENZA VIRUS 1-RVPPCR: NOT DETECTED
PARAINFLUENZA VIRUS 2-RVPPCR: NOT DETECTED
PARAINFLUENZA VIRUS 3-RVPPCR: NOT DETECTED
PARAINFLUENZA VIRUS 4-RVPPCR: NOT DETECTED
RHINOVIRUS / ENTEROVIRUS - RVPPCR: DETECTED — AB
Respiratory Syncytial Virus: NOT DETECTED

## 2016-12-10 LAB — COMPREHENSIVE METABOLIC PANEL
ALT: 24 U/L (ref 17–63)
AST: 19 U/L (ref 15–41)
Albumin: 3.9 g/dL (ref 3.5–5.0)
Alkaline Phosphatase: 66 U/L (ref 38–126)
Anion gap: 13 (ref 5–15)
BUN: 10 mg/dL (ref 6–20)
CHLORIDE: 110 mmol/L (ref 101–111)
CO2: 17 mmol/L — AB (ref 22–32)
CREATININE: 0.98 mg/dL (ref 0.61–1.24)
Calcium: 9.4 mg/dL (ref 8.9–10.3)
Glucose, Bld: 114 mg/dL — ABNORMAL HIGH (ref 65–99)
POTASSIUM: 4.1 mmol/L (ref 3.5–5.1)
SODIUM: 140 mmol/L (ref 135–145)
Total Bilirubin: 0.8 mg/dL (ref 0.3–1.2)
Total Protein: 7.6 g/dL (ref 6.5–8.1)

## 2016-12-10 LAB — INFLUENZA PANEL BY PCR (TYPE A & B)
Influenza A By PCR: NEGATIVE
Influenza B By PCR: NEGATIVE

## 2016-12-10 LAB — HIV ANTIBODY (ROUTINE TESTING W REFLEX): HIV Screen 4th Generation wRfx: NONREACTIVE

## 2016-12-10 LAB — CBC
HCT: 44.7 % (ref 39.0–52.0)
Hemoglobin: 15.5 g/dL (ref 13.0–17.0)
MCH: 31.6 pg (ref 26.0–34.0)
MCHC: 34.7 g/dL (ref 30.0–36.0)
MCV: 91 fL (ref 78.0–100.0)
PLATELETS: 367 10*3/uL (ref 150–400)
RBC: 4.91 MIL/uL (ref 4.22–5.81)
RDW: 12.9 % (ref 11.5–15.5)
WBC: 17.4 10*3/uL — ABNORMAL HIGH (ref 4.0–10.5)

## 2016-12-10 MED ORDER — LEVALBUTEROL HCL 1.25 MG/0.5ML IN NEBU
1.2500 mg | INHALATION_SOLUTION | Freq: Four times a day (QID) | RESPIRATORY_TRACT | Status: DC
  Administered 2016-12-11: 1.25 mg via RESPIRATORY_TRACT
  Filled 2016-12-10 (×4): qty 0.5

## 2016-12-10 MED ORDER — GUAIFENESIN ER 600 MG PO TB12
1200.0000 mg | ORAL_TABLET | Freq: Two times a day (BID) | ORAL | Status: DC
  Administered 2016-12-10 – 2016-12-11 (×2): 1200 mg via ORAL
  Filled 2016-12-10 (×2): qty 2

## 2016-12-10 NOTE — Progress Notes (Signed)
PROGRESS NOTE    Daniel Church  WUJ:[id number] DOB: 1995-02-13 DOA: 12/09/2016 PCP: Ronnald Nian, MD  Brief Narrative:Daniel Church is a 21 y.o. male with medical history significant for asthma, obesity and ongoing tobacco abuse initially presented to the ER early this a.m. at 430 with similar symptoms.  Initial lab evaluation and chest x-ray unremarkable.  Was given multiple nebulizer treatments as well as IV Solu-Medrol with improvement in symptoms and patient was subsequently discharged home.  He returns back to the ER for recurrence of chest tightness and wheezing.  Because of ongoing chest tightness  Assessment & Plan:   Acute asthma exacerbation -Secondary to rhinovirus -Improving however still has diffuse wheezes at this time continue IV Solu-Medrol DuoNeb nebs -Supportive care -Will need flu shot this year -Resume Advair discharge    Tobacco abuse -Counseled regarding cessation especially in the context of asthma    Abnormal EKG -Global ST segment downsloping/sagging with T wave inversion no old EKG for comparison -Due to tachycardia, beta agonists -Echocardiogram -ordered on admission will follow-up    Obesity (BMI 35.0-39.9 without comorbidity) -Weight strategies per PCP    DVT prophylaxis: Lovenox Code Status: Full Family Communication:  father at bedside Disposition Plan: Home tomorrow    Procedures:   Antimicrobials:    Subjective: -Breathing improving, not back to baseline yet  Objective: Vitals:   12/10/16 0045 12/10/16 0436 12/10/16 0950 12/10/16 1354  BP:  132/75 137/75 135/72  Pulse:  73 76 83  Resp:  18    Temp:  98.6 F (37 C) 98.7 F (37.1 C) 98.7 F (37.1 C)  TempSrc:  Oral Oral Oral  SpO2:  97% 96% 95%  Weight: 97.9 kg (215 lb 13.3 oz)     Height: 5\' 7"  (1.702 m)       Intake/Output Summary (Last 24 hours) at 12/10/16 1405 Last data filed at 12/10/16 1034  Gross per 24 hour  Intake          2485.41 ml  Output               300 ml  Net          2185.41 ml   Filed Weights   12/10/16 0045  Weight: 97.9 kg (215 lb 13.3 oz)    Examination:  General exam: Appears calm and comfortable  Respiratory system: Diffuse expiratory wheezes  Cardiovascular system: S1 & S2 heard, RRR. No JVD, murmurs, rubs, gallops Gastrointestinal system: Abdomen is nondistended, soft and nontender.Normal bowel sounds heard. Central nervous system: Alert and oriented. No focal neurological deficits. Extremities: Symmetric 5 x 5 power. Skin: No rashes, lesions or ulcers Psychiatry: Judgement and insight appear normal. Mood & affect appropriate.     Data Reviewed:   CBC:  Recent Labs Lab 12/09/16 0449 12/10/16 0633  WBC 11.8* 17.4*  NEUTROABS 9.4*  --   HGB 16.9 15.5  HCT 48.1 44.7  MCV 89.7 91.0  PLT 354 367   Basic Metabolic Panel:  Recent Labs Lab 12/09/16 0449 12/10/16 0633  NA 138 140  K 3.6 4.1  CL 104 110  CO2 20* 17*  GLUCOSE 112* 114*  BUN 8 10  CREATININE 1.18 0.98  CALCIUM 9.8 9.4   GFR: Estimated Creatinine Clearance: 134 mL/min (by C-G formula based on SCr of 0.98 mg/dL). Liver Function Tests:  Recent Labs Lab 12/10/16 0633  AST 19  ALT 24  ALKPHOS 66  BILITOT 0.8  PROT 7.6  ALBUMIN 3.9   No results  for input(s): LIPASE, AMYLASE in the last 168 hours. No results for input(s): AMMONIA in the last 168 hours. Coagulation Profile: No results for input(s): INR, PROTIME in the last 168 hours. Cardiac Enzymes:  Recent Labs Lab 12/09/16 1027  TROPONINI <0.03   BNP (last 3 results) No results for input(s): PROBNP in the last 8760 hours. HbA1C: No results for input(s): HGBA1C in the last 72 hours. CBG: No results for input(s): GLUCAP in the last 168 hours. Lipid Profile: No results for input(s): CHOL, HDL, LDLCALC, TRIG, CHOLHDL, LDLDIRECT in the last 72 hours. Thyroid Function Tests: No results for input(s): TSH, T4TOTAL, FREET4, T3FREE, THYROIDAB in the last 72  hours. Anemia Panel: No results for input(s): VITAMINB12, FOLATE, FERRITIN, TIBC, IRON, RETICCTPCT in the last 72 hours. Urine analysis:    Component Value Date/Time   BILIRUBINUR neg 09/20/2014 1319   PROTEINUR neg 09/20/2014 1319   UROBILINOGEN negative 09/20/2014 1319   NITRITE neg 09/20/2014 1319   LEUKOCYTESUR Negative 09/20/2014 1319   Sepsis Labs: @LABRCNTIP (procalcitonin:4,lacticidven:4)  ) Recent Results (from the past 240 hour(s))  Respiratory Panel by PCR     Status: Abnormal   Collection Time: 12/09/16 10:43 PM  Result Value Ref Range Status   Adenovirus NOT DETECTED NOT DETECTED Final   Coronavirus 229E NOT DETECTED NOT DETECTED Final   Coronavirus HKU1 NOT DETECTED NOT DETECTED Final   Coronavirus NL63 NOT DETECTED NOT DETECTED Final   Coronavirus OC43 NOT DETECTED NOT DETECTED Final   Metapneumovirus NOT DETECTED NOT DETECTED Final   Rhinovirus / Enterovirus DETECTED (A) NOT DETECTED Final   Influenza A NOT DETECTED NOT DETECTED Final   Influenza B NOT DETECTED NOT DETECTED Final   Parainfluenza Virus 1 NOT DETECTED NOT DETECTED Final   Parainfluenza Virus 2 NOT DETECTED NOT DETECTED Final   Parainfluenza Virus 3 NOT DETECTED NOT DETECTED Final   Parainfluenza Virus 4 NOT DETECTED NOT DETECTED Final   Respiratory Syncytial Virus NOT DETECTED NOT DETECTED Final   Bordetella pertussis NOT DETECTED NOT DETECTED Final   Chlamydophila pneumoniae NOT DETECTED NOT DETECTED Final   Mycoplasma pneumoniae NOT DETECTED NOT DETECTED Final         Radiology Studies: Dg Chest Portable 1 View  Result Date: 12/09/2016 CLINICAL DATA:  Asthma and shortness of breath EXAM: PORTABLE CHEST 1 VIEW COMPARISON:  Chest radiograph 11/29/2006 FINDINGS: The heart size and mediastinal contours are within normal limits. Both lungs are clear. The visualized skeletal structures are unremarkable. IMPRESSION: Clear lungs. Electronically Signed   By: Deatra RobinsonKevin  Herman M.D.   On: 12/09/2016  05:13        Scheduled Meds: . enoxaparin (LOVENOX) injection  40 mg Subcutaneous Q24H  . levalbuterol  1.25 mg Nebulization QID  . methylPREDNISolone (SOLU-MEDROL) injection  60 mg Intravenous Q6H  . sodium chloride flush  3 mL Intravenous Q12H   Continuous Infusions:   LOS: 0 days    Time spent: 35min    Zannie CovePreetha Aarin Sparkman, MD [facility] Page via [email or web address], password [email or web address] After 7PM please contact night-coverage  12/10/2016, 2:05 PM

## 2016-12-11 MED ORDER — ALBUTEROL SULFATE HFA 108 (90 BASE) MCG/ACT IN AERS: 1.0000 | INHALATION_SPRAY | Freq: Four times a day (QID) | RESPIRATORY_TRACT | 1 refills | Status: DC | PRN

## 2016-12-11 MED ORDER — FLUTICASONE-SALMETEROL 100-50 MCG/DOSE IN AEPB: 1.0000 | INHALATION_SPRAY | Freq: Two times a day (BID) | RESPIRATORY_TRACT | 0 refills | Status: DC

## 2016-12-11 MED ORDER — PREDNISONE 20 MG PO TABS: ORAL_TABLET | ORAL | 0 refills | Status: DC

## 2016-12-11 NOTE — Progress Notes (Signed)
Daniel Church to be D/C'd  per MD order. Discussed with the patient and all questions fully answered.  VSS, Skin clean, dry and intact without evidence of skin break down, no evidence of skin tears noted.  IV catheter discontinued intact. Site without signs and symptoms of complications. Dressing and pressure applied.  An After Visit Summary was printed and given to the patient. Patient received prescription.  D/c education completed with patient/family including follow up instructions, medication list, d/c activities limitations if indicated, with other d/c instructions as indicated by MD - patient able to verbalize understanding, all questions fully answered.   Patient instructed to return to ED, call 911, or call MD for any changes in condition.   Patient to be escorted via WC, and D/C home via private auto.

## 2016-12-11 NOTE — Progress Notes (Signed)
Notified patient he had d/c orders in, patient stated family was at funeral and would let me know when they were out so I could go over d/c paperwork with him then.

## 2016-12-21 ENCOUNTER — Inpatient Hospital Stay: Payer: BLUE CROSS/BLUE SHIELD | Admitting: Family Medicine

## 2016-12-30 NOTE — Discharge Summary (Signed)
Physician Discharge Summary  Daniel Church ZOX:[id number] DOB: 1995-12-18 DOA: 12/09/2016  PCP: Daniel Church, Daniel Church, Daniel Church  Admit date: 12/09/2016 Discharge date: 12/11/2016  Time spent: 35 minutes  Recommendations for Outpatient Follow-up:  PCP in 1 week  Discharge Diagnoses:  Principal Problem:   Acute asthma exacerbation Active Problems:   Obesity (BMI 35.0-39.9 without comorbidity)   Abnormal EKG   Tobacco abuse   Discharge Condition:stable  Diet recommendation:  Heart healthy  Filed Weights   12/10/16 0045  Weight: 97.9 kg (215 lb 13.3 oz)    History of present illness:  Daniel Horaharles E McDougaldis a 21 y.o.malewith medical history significant for asthma, obesity and ongoing tobacco abuse initially presented to the ER early this a.m. at 430 with similar symptoms. Initial lab evaluation andchest x-ray unremarkable. Was given multiple nebulizer treatmentsas well as IV Solu-Medrol with improvement in symptoms and patient was subsequently discharged home. He returns back to the ER for recurrence of chest tightness and wheezing. Because of ongoing chest tightness    Hospital Course:   Acute asthma exacerbation -Secondary to rhinovirus -Treated with IV steroids, nebulizers, supportive care -Clinically improved, Resumed Advair discharge -Advise smoking cessation and follow-up with PCP  Tobacco abuse -Counseled regarding cessation especially in the context of asthma  Abnormal EKG -Global ST segment downsloping/sagging with T wave inversion no old EKG for comparison -Due to tachycardia, beta agonists -Echocardiogram -ordered on admission was normal  Obesity (BMI 35.0-39.9 without comorbidity) -Weight strategies per PCP      Discharge Exam: Vitals:   12/11/16 0509 12/11/16 1307  BP: 121/61 (!) 117/56  Pulse: 75 76  Resp: 18 18  Temp: 98 F (36.7 Church) 98.5 F (36.9 Church)  SpO2: 98% 96%    General: AAOx3 Cardiovascular: S1S2/RRR Respiratory:  CTAB  Discharge Instructions   Discharge Instructions    Diet general   Complete by:  As directed    Increase activity slowly   Complete by:  As directed      Discharge Medication List as of 12/11/2016  3:27 PM    START taking these medications   Details  Fluticasone-Salmeterol (ADVAIR DISKUS) 100-50 MCG/DOSE AEPB Inhale 1 puff into the lungs 2 (two) times daily., Starting Fri 12/11/2016, Until Sat 12/11/2017, Print      CONTINUE these medications which have CHANGED   Details  albuterol (PROVENTIL HFA;VENTOLIN HFA) 108 (90 Base) MCG/ACT inhaler Inhale 1-2 puffs into the lungs every 6 (six) hours as needed for wheezing or shortness of breath., Starting Fri 12/11/2016, Print    predniSONE (DELTASONE) 20 MG tablet TAke 40mg  for 2days then 20mg  for 2days then STOP, Print      CONTINUE these medications which have NOT CHANGED   Details  albuterol (PROVENTIL) (2.5 MG/3ML) 0.083% nebulizer solution Take 3 mLs (2.5 mg total) by nebulization every 4 (four) hours as needed for wheezing or shortness of breath., Starting Wed 12/09/2016, Print    guaiFENesin (MUCINEX) 600 MG 12 hr tablet Take 600 mg by mouth 2 (two) times daily as needed for cough., Historical Med       No Known Allergies Follow-up Information    Daniel Church, Daniel Church, Daniel Church. Schedule an appointment as soon as possible for a visit in 1 week(s).   Specialty:  Family Medicine Contact information: [address] [place] [address] [phone]            The results of significant diagnostics from this hospitalization (including imaging, microbiology, ancillary and laboratory) are listed below for reference.  Significant Diagnostic Studies: Dg Chest Portable 1 View  Result Date: 12/09/2016 CLINICAL DATA:  Asthma and shortness of breath EXAM: PORTABLE CHEST 1 VIEW COMPARISON:  Chest radiograph 11/29/2006 FINDINGS: The heart size and mediastinal contours are within normal limits. Both lungs are clear. The  visualized skeletal structures are unremarkable. IMPRESSION: Clear lungs. Electronically Signed   By: Deatra RobinsonKevin  Herman M.D.   On: 12/09/2016 05:13    Microbiology: No results found for this or any previous visit (from the past 240 hour(s)).   Labs: Basic Metabolic Panel: No results for input(s): NA, K, CL, CO2, GLUCOSE, BUN, CREATININE, CALCIUM, MG, PHOS in the last 168 hours. Liver Function Tests: No results for input(s): AST, ALT, ALKPHOS, BILITOT, PROT, ALBUMIN in the last 168 hours. No results for input(s): LIPASE, AMYLASE in the last 168 hours. No results for input(s): AMMONIA in the last 168 hours. CBC: No results for input(s): WBC, NEUTROABS, HGB, HCT, MCV, PLT in the last 168 hours. Cardiac Enzymes: No results for input(s): CKTOTAL, CKMB, CKMBINDEX, TROPONINI in the last 168 hours. BNP: BNP (last 3 results) No results for input(s): BNP in the last 8760 hours.  ProBNP (last 3 results) No results for input(s): PROBNP in the last 8760 hours.  CBG: No results for input(s): GLUCAP in the last 168 hours.     Signed:  Zannie CovePreetha Hanako Tipping Daniel Church.  [facility] 12/30/2016, 2:03 PM

## 2017-01-20 ENCOUNTER — Telehealth: Payer: Self-pay | Admitting: Family Medicine

## 2017-01-20 MED ORDER — ALBUTEROL SULFATE HFA 108 (90 BASE) MCG/ACT IN AERS: 1.0000 | INHALATION_SPRAY | Freq: Four times a day (QID) | RESPIRATORY_TRACT | 0 refills | Status: DC | PRN

## 2017-01-20 NOTE — Telephone Encounter (Signed)
Pt's mother called and states that she is aware pt has not been seen in a while but is really needs his  Albuterol inhaler filled. This cold weather is really hard on him. She is requesting it be sent into [facility] on [address]. Please advise her at [phone].

## 2017-12-20 ENCOUNTER — Observation Stay (HOSPITAL_COMMUNITY)
Admission: EM | Admit: 2017-12-20 | Discharge: 2017-12-21 | Disposition: A | Discharge status: Home / Self Care | Payer: BLUE CROSS/BLUE SHIELD | Attending: Family Medicine | Admitting: Family Medicine

## 2017-12-20 ENCOUNTER — Encounter (HOSPITAL_COMMUNITY): Payer: Self-pay | Admitting: Emergency Medicine

## 2017-12-20 DIAGNOSIS — E669 Obesity, unspecified: Secondary | ICD-10-CM | POA: Diagnosis not present

## 2017-12-20 DIAGNOSIS — Z7951 Long term (current) use of inhaled steroids: Secondary | ICD-10-CM | POA: Diagnosis not present

## 2017-12-20 DIAGNOSIS — J45901 Unspecified asthma with (acute) exacerbation: Principal | ICD-10-CM | POA: Insufficient documentation

## 2017-12-20 DIAGNOSIS — Z79899 Other long term (current) drug therapy: Secondary | ICD-10-CM | POA: Insufficient documentation

## 2017-12-20 MED ORDER — ALBUTEROL (5 MG/ML) CONTINUOUS INHALATION SOLN
10.0000 mg/h | INHALATION_SOLUTION | Freq: Once | RESPIRATORY_TRACT | Status: AC
  Administered 2017-12-20: 10 mg/h via RESPIRATORY_TRACT
  Filled 2017-12-20: qty 20

## 2017-12-20 MED ORDER — ALBUTEROL SULFATE (2.5 MG/3ML) 0.083% IN NEBU
5.0000 mg | INHALATION_SOLUTION | Freq: Once | RESPIRATORY_TRACT | Status: AC
  Administered 2017-12-20: 5 mg via RESPIRATORY_TRACT
  Filled 2017-12-20: qty 6

## 2017-12-20 MED ORDER — METHYLPREDNISOLONE SODIUM SUCC 125 MG IJ SOLR
125.0000 mg | Freq: Once | INTRAMUSCULAR | Status: AC
  Administered 2017-12-21: 125 mg via INTRAVENOUS
  Filled 2017-12-20: qty 2

## 2017-12-20 MED ORDER — SODIUM CHLORIDE 0.9 % IV BOLUS
1000.0000 mL | Freq: Once | INTRAVENOUS | Status: AC
  Administered 2017-12-20: 1000 mL via INTRAVENOUS

## 2017-12-20 NOTE — ED Notes (Signed)
Family at bedside. 

## 2017-12-20 NOTE — ED Triage Notes (Signed)
Patient reports worsening asthma with wheezing and dry cough onset this weekend unrelieved by inhaler .

## 2017-12-20 NOTE — ED Provider Notes (Signed)
[facility] EMERGENCY DEPARTMENT Provider Note   CSN: [id number] Arrival date & time: 12/20/17  2145     History   Chief Complaint Chief Complaint  Patient presents with  . Asthma    HPI Daniel Church is a 22 y.o. male.  HPI   Pt is a 22 y/o male with a h/o asthma who presents to the ED today c/o productive cough with white sputum, shortness of breath, wheezing, that began yesterday. Has chest wall pain with cough. Also reports nasal congestion, rhinorrhea, and postnasal drip. No fevers or chills. sxs feel consistent to past asthma exacerbations. Pt is on singulair, advair, albuterol inhaler, and neb tx at home with no relief. States he was given symbycort samples by pcp last month but he has since run out. He used used 4 neb tx and used his inhaler x4 puffs.   He has been admitted to the hospital for his asthma in the past. Denies ever being intubated for his asthma.   Denies leg pain/swelling, hemoptysis, recent surgery/trauma, recent long travel, hormone use, personal hx of cancer, or hx of DVT/PE.   Past Medical History:  Diagnosis Date  . ADHD (attention deficit hyperactivity disorder)   . Allergy   . Asthma   . Obesity     Patient Active Problem List   Diagnosis Date Noted  . Asthma exacerbation 12/21/2017  . Acute asthma exacerbation 12/09/2016  . Obesity (BMI 35.0-39.9 without comorbidity) 12/09/2016  . Abnormal EKG 12/09/2016  . Tobacco abuse 12/09/2016  . Vision changes 02/27/2011  . Obesity 02/27/2011  . Asthma 11/21/2010  . ADHD (attention deficit hyperactivity disorder) 11/21/2010  . Obesity (BMI 30-39.9) 11/21/2010    History reviewed. No pertinent surgical history.      Home Medications    Prior to Admission medications   Medication Sig Start Date End Date Taking? Authorizing Provider  albuterol (PROVENTIL HFA;VENTOLIN HFA) 108 (90 Base) MCG/ACT inhaler Inhale 1-2 puffs into the lungs every 6 (six) hours as needed for  wheezing or shortness of breath. 01/20/17  Yes Ronnald Nian, MD  albuterol (PROVENTIL) (2.5 MG/3ML) 0.083% nebulizer solution Take 3 mLs (2.5 mg total) by nebulization every 4 (four) hours as needed for wheezing or shortness of breath. 12/09/16  Yes Zadie Rhine, MD  benzonatate (TESSALON) 100 MG capsule Take 100 mg by mouth 3 (three) times daily as needed for cough.   Yes [provider]  esomeprazole (NEXIUM) 20 MG capsule Take 40 mg by mouth daily at 12 noon.    Yes [provider]  fexofenadine (ALLEGRA) 180 MG tablet Take 180 mg by mouth daily.   Yes [provider]  Fluticasone-Salmeterol (ADVAIR DISKUS) 100-50 MCG/DOSE AEPB Inhale 1 puff into the lungs 2 (two) times daily. 12/11/16 12/21/17 Yes Zannie Cove, MD  guaiFENesin (MUCINEX) 600 MG 12 hr tablet Take 600 mg by mouth 2 (two) times daily as needed for cough.   Yes [provider]  montelukast (SINGULAIR) 10 MG tablet Take 10 mg by mouth at bedtime.   Yes [provider]  predniSONE (DELTASONE) 20 MG tablet TAke 40mg  for 2days then 20mg  for 2days then STOP Patient not taking: Reported on 12/21/2017 12/11/16   Zannie Cove, MD    Family History Family History  Problem Relation Age of Onset  . Hypertension Mother   . Diabetes Father   . Heart disease Father        CHF  . Irritable bowel syndrome Father   .  Hyperlipidemia Father   . Hypertension Father   . Heart disease Maternal Uncle 47       MI  . Bipolar disorder Maternal Uncle   . Heart disease Maternal Uncle 51       MI  . Cancer Neg Hx     Social History Social History   Tobacco Use  . Smoking status: Never Smoker  . Smokeless tobacco: Never Used  Substance Use Topics  . Alcohol use: No  . Drug use: Yes    Types: Marijuana     Allergies   Patient has no known allergies.   Review of Systems Review of Systems  Constitutional: Negative for chills and fever.  HENT: Positive for congestion, postnasal  drip and rhinorrhea. Negative for sore throat.   Eyes: Negative for visual disturbance.  Respiratory: Positive for cough, shortness of breath and wheezing.   Cardiovascular: Negative for chest pain and leg swelling.  Gastrointestinal: Negative for abdominal pain, constipation, diarrhea, nausea and vomiting.  Genitourinary: Negative for flank pain.  Musculoskeletal: Negative for back pain.  Skin: Negative for rash.  Neurological: Negative for headaches.    Physical Exam Updated Vital Signs BP (!) 166/84 (BP Location: Right Arm)   Pulse (!) 106   Temp 98.6 F (37 C) (Oral)   Resp 18   SpO2 95%   Physical Exam  Constitutional: He appears well-developed and well-nourished.  HENT:  Head: Normocephalic and atraumatic.  Eyes: Conjunctivae are normal.  Neck: Neck supple.  Cardiovascular: Regular rhythm and normal heart sounds.  No murmur heard. tahcycardic  Pulmonary/Chest:  Tachypneic, diffuse wheezing in all lung fields, speaking in short sentences  Abdominal: Soft. Bowel sounds are normal. He exhibits no distension. There is no tenderness. There is no guarding.  Musculoskeletal: He exhibits no edema.  No calf TTP, erythema, swelling.  Neurological: He is alert.  Skin: Skin is warm and dry.  Psychiatric: He has a normal mood and affect.  Nursing note and vitals reviewed.  ED Treatments / Results  Labs (all labs ordered are listed, but only abnormal results are displayed) Labs Reviewed  CBC WITH DIFFERENTIAL/PLATELET  BASIC METABOLIC PANEL    EKG None  Radiology Dg Chest 2 View  Result Date: 12/21/2017 CLINICAL DATA:  Acute onset of wheezing and dry cough. EXAM: CHEST - 2 VIEW COMPARISON:  Chest radiograph performed 12/09/2016 FINDINGS: The lungs are well-aerated. Mild peribronchial thickening is noted. There is no evidence of focal opacification, pleural effusion or pneumothorax. The heart is normal in size; the mediastinal contour is within normal limits. No acute  osseous abnormalities are seen. IMPRESSION: Mild peribronchial thickening noted; lungs otherwise clear. Electronically Signed   By: Roanna Raider M.D.   On: 12/21/2017 00:43    Procedures Procedures (including critical care time)  CRITICAL CARE Performed by: Karrie Meres   Total critical care time: 36 minutes  Critical care time was exclusive of separately billable procedures and treating other patients.  Critical care was necessary to treat or prevent imminent or life-threatening deterioration.  Critical care was time spent personally by me on the following activities: development of treatment plan with patient and/or surrogate as well as nursing, discussions with consultants, evaluation of patient's response to treatment, examination of patient, obtaining history from patient or surrogate, ordering and performing treatments and interventions, ordering and review of laboratory studies, ordering and review of radiographic studies, pulse oximetry and re-evaluation of patient's condition.   Medications Ordered in ED Medications  ipratropium (ATROVENT) nebulizer solution 0.5  mg (has no administration in time range)  levalbuterol (XOPENEX) nebulizer solution 1.25 mg (has no administration in time range)  dextromethorphan-guaiFENesin (MUCINEX DM) 30-600 MG per 12 hr tablet 1 tablet (has no administration in time range)  albuterol (PROVENTIL) (2.5 MG/3ML) 0.083% nebulizer solution 5 mg (5 mg Nebulization Given 12/20/17 2156)  sodium chloride 0.9 % bolus 1,000 mL (0 mLs Intravenous Stopped 12/21/17 0045)  albuterol (PROVENTIL,VENTOLIN) solution continuous neb (10 mg/hr Nebulization Given 12/20/17 2333)  methylPREDNISolone sodium succinate (SOLU-MEDROL) 125 mg/2 mL injection 125 mg (125 mg Intravenous Given 12/21/17 0045)  ipratropium-albuterol (DUONEB) 0.5-2.5 (3) MG/3ML nebulizer solution 3 mL (3 mLs Nebulization Given 12/21/17 0209)  magnesium sulfate IVPB 2 g 50 mL (0 g Intravenous  Stopped 12/21/17 0349)  acetaminophen (TYLENOL) tablet 650 mg (650 mg Oral Given 12/21/17 0203)     Initial Impression / Assessment and Plan / ED Course  I have reviewed the triage vital signs and the nursing notes.  Pertinent labs & imaging results that were available during my care of the patient were reviewed by me and considered in my medical decision making (see chart for details).     Final Clinical Impressions(s) / ED Diagnoses   Final diagnoses:  Exacerbation of asthma, unspecified asthma severity, unspecified whether persistent   Pt presenting with asthma exacerbation. sats WNL but pt tachypneic. No fevers. Otherwise vitals stable.  Diffuse wheezing on exam. No pneumonia on CXR.   He was initially given Solu-Medrol and a continuous nebulizer treatment.  On reexamination still had diffuse wheezing on exam. I personally ambulated the patient after his initial continuous neb and he maintained his O2 saturations at 96 to 99% on room air but felt short of breath.  Following this he was given mag and an additional DuoNeb.  He was re-ambulated and was able to maintain his sats however became short of breath with ambulation.  Repeat lung exam with continued wheezing.  Will plan for admission for asthma exacerbation given patient's persistent shortness of breath and wheezing despite multiple treatment methods.  ED Discharge Orders    None       Karrie Meres, [place] 12/21/17 4098    Tegeler, Canary Brim, MD 12/21/17 1325

## 2017-12-21 ENCOUNTER — Emergency Department (HOSPITAL_COMMUNITY): Payer: BLUE CROSS/BLUE SHIELD

## 2017-12-21 ENCOUNTER — Other Ambulatory Visit: Payer: Self-pay

## 2017-12-21 ENCOUNTER — Encounter (HOSPITAL_COMMUNITY): Payer: Self-pay | Admitting: Emergency Medicine

## 2017-12-21 DIAGNOSIS — J45901 Unspecified asthma with (acute) exacerbation: Secondary | ICD-10-CM | POA: Diagnosis not present

## 2017-12-21 DIAGNOSIS — E669 Obesity, unspecified: Secondary | ICD-10-CM

## 2017-12-21 LAB — CBC WITH DIFFERENTIAL/PLATELET
Abs Immature Granulocytes: 0.06 10*3/uL (ref 0.00–0.07)
Basophils Absolute: 0 10*3/uL (ref 0.0–0.1)
Basophils Relative: 0 %
EOS ABS: 0 10*3/uL (ref 0.0–0.5)
Eosinophils Relative: 0 %
HEMATOCRIT: 47.3 % (ref 39.0–52.0)
Hemoglobin: 15.7 g/dL (ref 13.0–17.0)
IMMATURE GRANULOCYTES: 1 %
LYMPHS ABS: 0.4 10*3/uL — AB (ref 0.7–4.0)
Lymphocytes Relative: 3 %
MCH: 29.9 pg (ref 26.0–34.0)
MCHC: 33.2 g/dL (ref 30.0–36.0)
MCV: 90.1 fL (ref 80.0–100.0)
MONOS PCT: 1 %
Monocytes Absolute: 0.2 10*3/uL (ref 0.1–1.0)
NEUTROS PCT: 95 %
Neutro Abs: 11.3 10*3/uL — ABNORMAL HIGH (ref 1.7–7.7)
Platelets: 312 10*3/uL (ref 150–400)
RBC: 5.25 MIL/uL (ref 4.22–5.81)
RDW: 12.2 % (ref 11.5–15.5)
WBC: 11.9 10*3/uL — ABNORMAL HIGH (ref 4.0–10.5)
nRBC: 0 % (ref 0.0–0.2)

## 2017-12-21 LAB — BASIC METABOLIC PANEL
ANION GAP: 11 (ref 5–15)
BUN: 15 mg/dL (ref 6–20)
CO2: 19 mmol/L — AB (ref 22–32)
Calcium: 9.3 mg/dL (ref 8.9–10.3)
Chloride: 107 mmol/L (ref 98–111)
Creatinine, Ser: 1.19 mg/dL (ref 0.61–1.24)
GFR calc Af Amer: >60 mL/min (ref >60)
GLUCOSE: 131 mg/dL — AB (ref 70–99)
Potassium: 3.8 mmol/L (ref 3.5–5.1)
Sodium: 137 mmol/L (ref 135–145)

## 2017-12-21 MED ORDER — GUAIFENESIN ER 600 MG PO TB12: 600.0000 mg | ORAL_TABLET | Freq: Two times a day (BID) | ORAL | 2 refills | Status: DC | PRN

## 2017-12-21 MED ORDER — FEXOFENADINE HCL 180 MG PO TABS: 180.0000 mg | ORAL_TABLET | Freq: Every day | ORAL | 3 refills | Status: AC

## 2017-12-21 MED ORDER — LEVALBUTEROL HCL 1.25 MG/0.5ML IN NEBU
1.2500 mg | INHALATION_SOLUTION | Freq: Four times a day (QID) | RESPIRATORY_TRACT | Status: DC
Start: 2017-12-21 — End: 2017-12-21

## 2017-12-21 MED ORDER — ALBUTEROL SULFATE (2.5 MG/3ML) 0.083% IN NEBU: 2.5000 mg | INHALATION_SOLUTION | RESPIRATORY_TRACT | 1 refills | Status: AC | PRN

## 2017-12-21 MED ORDER — GUAIFENESIN ER 600 MG PO TB12
1200.0000 mg | ORAL_TABLET | Freq: Once | ORAL | Status: AC
  Administered 2017-12-21: 1200 mg via ORAL
  Filled 2017-12-21: qty 2

## 2017-12-21 MED ORDER — PREDNISONE 20 MG PO TABS
60.0000 mg | ORAL_TABLET | Freq: Once | ORAL | Status: AC
  Administered 2017-12-21: 60 mg via ORAL
  Filled 2017-12-21: qty 3

## 2017-12-21 MED ORDER — INFLUENZA VAC SPLIT QUAD 0.5 ML IM SUSY: 0.5000 mL | PREFILLED_SYRINGE | INTRAMUSCULAR | Status: DC

## 2017-12-21 MED ORDER — IPRATROPIUM-ALBUTEROL 0.5-2.5 (3) MG/3ML IN SOLN: 3.0000 mL | Freq: Once | RESPIRATORY_TRACT | Status: DC

## 2017-12-21 MED ORDER — ALBUTEROL SULFATE HFA 108 (90 BASE) MCG/ACT IN AERS: 2.0000 | INHALATION_SPRAY | Freq: Four times a day (QID) | RESPIRATORY_TRACT | 0 refills | Status: AC | PRN

## 2017-12-21 MED ORDER — LEVALBUTEROL HCL 1.25 MG/0.5ML IN NEBU
1.2500 mg | INHALATION_SOLUTION | Freq: Four times a day (QID) | RESPIRATORY_TRACT | Status: DC
Start: 2017-12-21 — End: 2017-12-21
  Administered 2017-12-21: 1.25 mg via RESPIRATORY_TRACT
  Filled 2017-12-21: qty 0.5

## 2017-12-21 MED ORDER — MAGNESIUM SULFATE 2 GM/50ML IV SOLN
2.0000 g | Freq: Once | INTRAVENOUS | Status: AC
Start: 2017-12-21 — End: 2017-12-21
  Administered 2017-12-21: 2 g via INTRAVENOUS
  Filled 2017-12-21: qty 50

## 2017-12-21 MED ORDER — PREDNISONE 20 MG PO TABS: ORAL_TABLET | ORAL | 2 refills | Status: DC

## 2017-12-21 MED ORDER — IPRATROPIUM BROMIDE 0.02 % IN SOLN: 0.5000 mg | RESPIRATORY_TRACT | Status: DC

## 2017-12-21 MED ORDER — LEVALBUTEROL HCL 0.63 MG/3ML IN NEBU: 0.6300 mg | INHALATION_SOLUTION | RESPIRATORY_TRACT | Status: DC | PRN

## 2017-12-21 MED ORDER — FLUTICASONE-SALMETEROL 100-50 MCG/DOSE IN AEPB: 1.0000 | INHALATION_SPRAY | Freq: Two times a day (BID) | RESPIRATORY_TRACT | 6 refills | Status: AC

## 2017-12-21 MED ORDER — DM-GUAIFENESIN ER 30-600 MG PO TB12: 1.0000 | ORAL_TABLET | Freq: Two times a day (BID) | ORAL | Status: DC | PRN

## 2017-12-21 MED ORDER — IPRATROPIUM BROMIDE 0.02 % IN SOLN
0.5000 mg | Freq: Four times a day (QID) | RESPIRATORY_TRACT | Status: DC
  Administered 2017-12-21: 0.5 mg via RESPIRATORY_TRACT
  Filled 2017-12-21: qty 2.5

## 2017-12-21 MED ORDER — ACETAMINOPHEN 325 MG PO TABS
650.0000 mg | ORAL_TABLET | Freq: Once | ORAL | Status: AC
  Administered 2017-12-21: 650 mg via ORAL
  Filled 2017-12-21: qty 2

## 2017-12-21 MED ORDER — IPRATROPIUM-ALBUTEROL 0.5-2.5 (3) MG/3ML IN SOLN
3.0000 mL | Freq: Once | RESPIRATORY_TRACT | Status: AC
  Administered 2017-12-21: 3 mL via RESPIRATORY_TRACT
  Filled 2017-12-21: qty 3

## 2017-12-21 MED ORDER — MONTELUKAST SODIUM 10 MG PO TABS: 10.0000 mg | ORAL_TABLET | Freq: Every day | ORAL | 3 refills | Status: AC

## 2017-12-21 NOTE — ED Notes (Signed)
When ambulated pt.heartrate 112 oxygen was 96% pt.did well ambulating

## 2017-12-21 NOTE — ED Notes (Signed)
Patient transported to X-ray 

## 2017-12-21 NOTE — ED Notes (Signed)
PA AWARE PT AMBULATED WITH NO DIFFICULTY. ALSO AWARE THAT PT MOTHER HAS CONCERNS ABOUT PT BEING DC

## 2017-12-21 NOTE — Discharge Instructions (Signed)
1)TAke Prednisone 40mg  for 3 days then 20mg  for 2days then STOP--always take with Food, repeat as indicated per ASTHMA action Plan (do not use > 2x a month) 2)Avoid smoke exposure 3)Drink plenty fluids and maintain adequate hydration 4) once your acute asthma flareup is over-----you need to do your peak flow meter regularly to establish your normal peak flow range when you are not ill 5) once you establish a normal peak flow range when you are not ill, this will have to be incorporated into your asthma action plan 6)Please talk to your primary care physician about an asthma action plan that he can use when you start to get sick and you are beginning to have an asthma flareup  7) please take all your medications as prescribed 8)Try and go back to school as discussed-----you have High Potential

## 2017-12-21 NOTE — Discharge Summary (Signed)
Daniel Church, is a 22 y.o. male  DOB [birth year]  MRN [id number].  Admission date:  12/20/2017  Admitting Physician  Lorretta Harp, MD  Discharge Date:  12/21/2017   Primary MD  Renaye Rakers, MD  Recommendations for primary care physician for things to follow:    1)TAke Prednisone 40mg  for 3 days then 20mg  for 2days then STOP--always take with Food, repeat as indicated per ASTHMA action Plan (do not use > 2x a month) 2)Avoid smoke exposure 3)Drink plenty fluids and maintain adequate hydration 4) once your acute asthma flareup is over-----you need to do your peak flow meter regularly to establish your normal peak flow range when you are not ill 5) once you establish a normal peak flow range when you are not ill, this will have to be incorporated into your asthma action plan 6)Please talk to your primary care physician about an asthma action plan that he can use when you start to get sick and you are beginning to have an asthma flareup  7) please take all your medications as prescribed 8)Try and go back to school as discussed-----you have High Potential   Admission Diagnosis  Exacerbation of asthma, unspecified asthma severity, unspecified whether persistent [J45.901]   Discharge Diagnosis  Exacerbation of asthma, unspecified asthma severity, unspecified whether persistent [J45.901]    Active Problems:   Obesity (BMI 35.0-39.9 without comorbidity)   Asthma exacerbation      Past Medical History:  Diagnosis Date  . ADHD (attention deficit hyperactivity disorder)   . Allergy   . Asthma   . Obesity     History reviewed. No pertinent surgical history.   HPI  from the history and physical done on the day of admission:    Daniel Church  is a 22 y.o. male with long-standing history of asthma and prior hospitalizations (not recently) for asthma but no prior intubation who presents with 24-hour  history of URI symptoms worse over the last 8 hours with persistent coughing, wheezing and shortness of breath, cough is productive with white sputum, no fevers no chills patient complains of a rhinorrhea nasal congestion and sneezing  Patient states he has been compliant with his medications  Patient and mom states that he gets this way every fall  In the ED patient found to be tachycardic, tachypneic and subjectively short of breath  Chest x-ray in the ED with mild peribronchial thickening without acute findings otherwise  Patient denies tobacco or alcohol use  symptoms of persistent Asthma for 24 hours, patient and his mom are uncomfortable going home after initial treatment in the ED due to tachycardia, tachypnea and subjective shortness of breath, will place in observation and reevaluate, continue bronchodilators, steroids and mucolytics       Hospital Course:     1)Acute asthma exacerbation-  overall much improved after nebulizer treatments and IV Solu-Medrol, post ambulation O2 sats 96 to 97% on room air, patient speaking in complete sentences , appropriate use of peak flow meter and asthma action plan discussed with  patient and mom, discharged home on prednisone taper, follow-up with PCP as advised  2)Obesity----lifestyle, dietary modifications emphasized  Discharge Condition: stable  Follow UP---PCP  Diet and Activity recommendation:  As advised  Discharge Instructions    Discharge Instructions    Call MD for:  difficulty breathing, headache or visual disturbances   Complete by:  As directed    Call MD for:  persistant dizziness or light-headedness   Complete by:  As directed    Call MD for:  persistant nausea and vomiting   Complete by:  As directed    Call MD for:  severe uncontrolled pain   Complete by:  As directed    Call MD for:  temperature >100.4   Complete by:  As directed    Diet - low sodium heart healthy   Complete by:  As directed    Discharge  instructions   Complete by:  As directed    1)TAke Prednisone 40mg  for 3 days then 20mg  for 2days then STOP--always take with Food, repeat as indicated per ASTHMA action Plan (do not use > 2x a month) 2)Avoid smoke exposure 3)Drink plenty fluids and maintain adequate hydration 4) once your acute asthma flareup is over-----you need to do your peak flow meter regularly to establish your normal peak flow range when you are not ill 5) once you establish a normal peak flow range when you are not ill, this will have to be incorporated into your asthma action plan 6)Please talk to your primary care physician about an asthma action plan that he can use when you start to get sick and you are beginning to have an asthma flareup  7) please take all your medications as prescribed 8)Try and go back to school as discussed-----you have High Potential   Increase activity slowly   Complete by:  As directed         Discharge Medications     Allergies as of 12/21/2017   No Known Allergies     Medication List    STOP taking these medications   benzonatate 100 MG capsule Commonly known as:  TESSALON     TAKE these medications   albuterol 108 (90 Base) MCG/ACT inhaler Commonly known as:  PROVENTIL HFA;VENTOLIN HFA Inhale 2 puffs into the lungs every 6 (six) hours as needed for wheezing or shortness of breath. What changed:  how much to take   albuterol (2.5 MG/3ML) 0.083% nebulizer solution Commonly known as:  PROVENTIL Take 3 mLs (2.5 mg total) by nebulization every 4 (four) hours as needed for wheezing or shortness of breath. What changed:  Another medication with the same name was changed. Make sure you understand how and when to take each.   esomeprazole 20 MG capsule Commonly known as:  NEXIUM Take 40 mg by mouth daily at 12 noon.   fexofenadine 180 MG tablet Commonly known as:  ALLEGRA Take 1 tablet (180 mg total) by mouth daily.   Fluticasone-Salmeterol 100-50 MCG/DOSE  Aepb Commonly known as:  ADVAIR Inhale 1 puff into the lungs 2 (two) times daily. For asthma What changed:  additional instructions   guaiFENesin 600 MG 12 hr tablet Commonly known as:  MUCINEX Take 1 tablet (600 mg total) by mouth 2 (two) times daily as needed for cough.   montelukast 10 MG tablet Commonly known as:  SINGULAIR Take 1 tablet (10 mg total) by mouth at bedtime.   predniSONE 20 MG tablet Commonly known as:  DELTASONE TAke 40mg  for 3 days  then 20mg  for 2days then STOP--always take with Food, repeat as indicated per ASTHMA action Plan (do not use > 2x a month) What changed:  additional instructions       Major procedures and Radiology Reports - PLEASE review detailed and final reports for all details, in brief -    Dg Chest 2 View  Result Date: 12/21/2017 CLINICAL DATA:  Acute onset of wheezing and dry cough. EXAM: CHEST - 2 VIEW COMPARISON:  Chest radiograph performed 12/09/2016 FINDINGS: The lungs are well-aerated. Mild peribronchial thickening is noted. There is no evidence of focal opacification, pleural effusion or pneumothorax. The heart is normal in size; the mediastinal contour is within normal limits. No acute osseous abnormalities are seen. IMPRESSION: Mild peribronchial thickening noted; lungs otherwise clear. Electronically Signed   By: Roanna RaiderJeffery  Chang M.D.   On: 12/21/2017 00:43   Today   Subjective    Devonn Calvin today has no new concerns, plan of care discussed with patient and his mom at bedside, questions answered          Patient has been seen and examined prior to discharge   Objective   Blood pressure 131/74, pulse 93, temperature 98.7 F (37.1 C), temperature source Oral, resp. rate 17, SpO2 97 %.   Intake/Output Summary (Last 24 hours) at 12/21/2017 1253 Last data filed at 12/21/2017 0600 Gross per 24 hour  Intake 1328.33 ml  Output -  Net 1328.33 ml    Exam Gen:- Awake Alert, no acute distress , speaking in complete  sentences HEENT:- [place].AT, No sclera icterus Neck-Supple Neck,No JVD,.  Lungs-mostly clear bilaterally with improved air movement  CV- S1, S2 normal, regular Abd-  +ve B.Sounds, Abd Soft, No tenderness,    Extremity/Skin:- No  edema,   good pulses Psych-affect is appropriate, oriented x3 Neuro-no new focal deficits, no tremors    Data Review   CBC w Diff:  Lab Results  Component Value Date   WBC 11.9 (H) 12/21/2017   HGB 15.7 12/21/2017   HCT 47.3 12/21/2017   PLT 312 12/21/2017   LYMPHOPCT 3 12/21/2017   MONOPCT 1 12/21/2017   EOSPCT 0 12/21/2017   BASOPCT 0 12/21/2017    CMP:  Lab Results  Component Value Date   NA 137 12/21/2017   K 3.8 12/21/2017   CL 107 12/21/2017   CO2 19 (L) 12/21/2017   BUN 15 12/21/2017   CREATININE 1.19 12/21/2017   CREATININE 0.95 10/25/2013   PROT 7.6 12/10/2016   ALBUMIN 3.9 12/10/2016   BILITOT 0.8 12/10/2016   ALKPHOS 66 12/10/2016   AST 19 12/10/2016   ALT 24 12/10/2016  .   Total Discharge time is about 33 minutes  Shon Haleourage Luiscarlos Kaczmarczyk M.D on 12/21/2017 at 12:53 PM  Pager---[phone]  Go to [email or web address] - password [email or web address] for contact info  [facility] - Office  [phone]

## 2017-12-21 NOTE — ED Notes (Signed)
Neb tx stopped, pt transported to xray

## 2017-12-21 NOTE — ED Notes (Signed)
Breathing treatment is complete now

## 2017-12-21 NOTE — Care Management (Signed)
This is a no charge note  Pending admission per PA, [place]  22 year old male with past medical history of asthma, ADHD, obesity, who presents with cough, wheezing, no infiltration on chest x-ray, clinically consistent with asthma exacerbation.  Placed on telemetry bed of observation.   Lorretta HarpXilin Loreley Schwall, MD  [facility] Pager [phone]  If 7PM-7AM, please contact night-coverage [email or web address] Password [email or web address] 12/21/2017, 4:35 AM

## 2017-12-21 NOTE — H&P (Signed)
Patient Demographics:    Daniel Church, is a 22 y.o. male  MRN: [id number]   DOB - Oct 13, 1995  Admit Date - 12/20/2017  Outpatient Primary MD for the patient is Renaye Rakers, MD   Assessment & Plan:    Active Problems:   Obesity (BMI 35.0-39.9 without comorbidity)   Asthma exacerbation    1)Acute Exacerbation of Asthma--- symptoms of persistent Asthma for 24 hours, patient and his mom are uncomfortable going home after initial treatment in the ED due to tachycardia, tachypnea and subjective shortness of breath, will place in observation and reevaluate, continue bronchodilators, steroids and mucolytics  2)Obesity----this complicates overall care, lifestyle and dietary modifications advised  With History of - Reviewed by me  Past Medical History:  Diagnosis Date  . ADHD (attention deficit hyperactivity disorder)   . Allergy   . Asthma   . Obesity          Chief Complaint  Patient presents with  . Asthma      HPI:    Daniel Church  is a 22 y.o. male with long-standing history of asthma and prior hospitalizations (not recently) for asthma but no prior intubation who presents with 24-hour history of URI symptoms worse over the last 8 hours with persistent coughing, wheezing and shortness of breath, cough is productive with white sputum, no fevers no chills patient complains of a rhinorrhea nasal congestion and sneezing  Patient states he has been compliant with his medications  Patient and mom states that he gets this way every fall  In the ED patient found to be tachycardic, tachypneic and subjectively short of breath  Chest x-ray in the ED with mild peribronchial thickening without acute findings otherwise  Patient denies tobacco or alcohol use  symptoms of persistent Asthma for 24  hours, patient and his mom are uncomfortable going home after initial treatment in the ED due to tachycardia, tachypnea and subjective shortness of breath, will place in observation and reevaluate, continue bronchodilators, steroids and mucolytics   Review of systems:    In addition to the HPI above,   A full Review of  Systems was done, all other systems reviewed are negative except as noted above in HPI , .    Social History:  Reviewed by me    Social History   Tobacco Use  . Smoking status: Never Smoker  . Smokeless tobacco: Never Used  Substance Use Topics  . Alcohol use: No       Family History :  Reviewed by me    Family History  Problem Relation Age of Onset  . Hypertension Mother   . Diabetes Father   . Heart disease Father        CHF  . Irritable bowel syndrome Father   . Hyperlipidemia Father   . Hypertension Father   . Heart disease Maternal Uncle 47       MI  . Bipolar disorder Maternal Uncle   .  Heart disease Maternal Uncle 51       MI  . Cancer Neg Hx     Home Medications:   Prior to Admission medications   Medication Sig Start Date End Date Taking? Authorizing Provider  benzonatate (TESSALON) 100 MG capsule Take 100 mg by mouth 3 (three) times daily as needed for cough.   Yes [provider]  esomeprazole (NEXIUM) 20 MG capsule Take 40 mg by mouth daily at 12 noon.    Yes [provider]  albuterol (PROVENTIL HFA;VENTOLIN HFA) 108 (90 Base) MCG/ACT inhaler Inhale 2 puffs into the lungs every 6 (six) hours as needed for wheezing or shortness of breath. 12/21/17   Shon HaleEmokpae, Naftuli Dalsanto, MD  albuterol (PROVENTIL) (2.5 MG/3ML) 0.083% nebulizer solution Take 3 mLs (2.5 mg total) by nebulization every 4 (four) hours as needed for wheezing or shortness of breath. 12/21/17   Shon HaleEmokpae, Juni Glaab, MD  fexofenadine (ALLEGRA) 180 MG tablet Take 1 tablet (180 mg total) by mouth daily. 12/21/17   Shon HaleEmokpae, Riki Berninger, MD  Fluticasone-Salmeterol (ADVAIR  DISKUS) 100-50 MCG/DOSE AEPB Inhale 1 puff into the lungs 2 (two) times daily. For asthma 12/21/17 12/21/18  Shon HaleEmokpae, Callia Swim, MD  guaiFENesin (MUCINEX) 600 MG 12 hr tablet Take 1 tablet (600 mg total) by mouth 2 (two) times daily as needed for cough. 12/21/17   Shon HaleEmokpae, Tayjon Halladay, MD  montelukast (SINGULAIR) 10 MG tablet Take 1 tablet (10 mg total) by mouth at bedtime. 12/21/17   Shon HaleEmokpae, Betty Brooks, MD  predniSONE (DELTASONE) 20 MG tablet TAke 40mg  for 3 days then 20mg  for 2days then STOP--always take with Food, repeat as indicated per ASTHMA action Plan (do not use > 2x a month) 12/21/17   Shon HaleEmokpae, Jas Betten, MD     Allergies:    No Known Allergies   Physical Exam:   Vitals  Blood pressure 131/74, pulse 93, temperature 98.7 F (37.1 C), temperature source Oral, resp. rate 17, SpO2 97 %.  Physical Examination: General appearance - alert, short sentences  mental status - alert, oriented to person, place, and time,  Eyes - sclera anicteric Neck - supple, no JVD elevation , Chest -diminished bilaterally with scattered wheezes and rhonchi no rales , tachypneic heart - S1 and S2 normal, regular , tachy Abdomen - soft, nontender, nondistended, no masses or organomegaly Neurological - screening mental status exam normal, neck supple without rigidity, cranial nerves II through XII intact, DTR's normal and symmetric Extremities - no pedal edema noted, intact peripheral pulses  Skin - warm, dry     Data Review:    CBC Recent Labs  Lab 12/21/17 0507  WBC 11.9*  HGB 15.7  HCT 47.3  PLT 312  MCV 90.1  MCH 29.9  MCHC 33.2  RDW 12.2  LYMPHSABS 0.4*  MONOABS 0.2  EOSABS 0.0  BASOSABS 0.0   ------------------------------------------------------------------------------------------------------------------  Chemistries  Recent Labs  Lab 12/21/17 0507  NA 137  K 3.8  CL 107  CO2 19*  GLUCOSE 131*  BUN 15  CREATININE 1.19  CALCIUM 9.3    ------------------------------------------------------------------------------------------------------------------ CrCl cannot be calculated (Unknown ideal weight.). ------------------------------------------------------------------------------------------------------------------ No results for input(s): TSH, T4TOTAL, T3FREE, THYROIDAB in the last 72 hours.  Invalid input(s): FREET3   Coagulation profile No results for input(s): INR, PROTIME in the last 168 hours. ------------------------------------------------------------------------------------------------------------------- No results for input(s): DDIMER in the last 72 hours. -------------------------------------------------------------------------------------------------------------------  Cardiac Enzymes No results for input(s): CKMB, TROPONINI, MYOGLOBIN in the last 168 hours.  Invalid input(s): CK ------------------------------------------------------------------------------------------------------------------ No results found for: BNP   ---------------------------------------------------------------------------------------------------------------  Urinalysis    Component Value Date/Time   BILIRUBINUR neg 09/20/2014 1319   PROTEINUR neg 09/20/2014 1319   UROBILINOGEN negative 09/20/2014 1319   NITRITE neg 09/20/2014 1319   LEUKOCYTESUR Negative 09/20/2014 1319    ----------------------------------------------------------------------------------------------------------------   Imaging Results:    Dg Chest 2 View  Result Date: 12/21/2017 CLINICAL DATA:  Acute onset of wheezing and dry cough. EXAM: CHEST - 2 VIEW COMPARISON:  Chest radiograph performed 12/09/2016 FINDINGS: The lungs are well-aerated. Mild peribronchial thickening is noted. There is no evidence of focal opacification, pleural effusion or pneumothorax. The heart is normal in size; the mediastinal contour is within normal limits. No acute osseous  abnormalities are seen. IMPRESSION: Mild peribronchial thickening noted; lungs otherwise clear. Electronically Signed   By: Roanna Raider M.D.   On: 12/21/2017 00:43    Radiological Exams on Admission: Dg Chest 2 View  Result Date: 12/21/2017 CLINICAL DATA:  Acute onset of wheezing and dry cough. EXAM: CHEST - 2 VIEW COMPARISON:  Chest radiograph performed 12/09/2016 FINDINGS: The lungs are well-aerated. Mild peribronchial thickening is noted. There is no evidence of focal opacification, pleural effusion or pneumothorax. The heart is normal in size; the mediastinal contour is within normal limits. No acute osseous abnormalities are seen. IMPRESSION: Mild peribronchial thickening noted; lungs otherwise clear. Electronically Signed   By: Roanna Raider M.D.   On: 12/21/2017 00:43    DVT Prophylaxis -SCD   AM Labs Ordered, also please review Full Orders  Family Communication: Admission, patients condition and plan of care including tests being ordered have been discussed with the patient and mother who indicate understanding and agree with the plan   Code Status - Full Code  Likely DC to  home  Condition   stable  Shon Hale M.D on 12/21/2017 at 12:50 PM Pager---[phone] Go to [email or web address] - [email or web address] for contact info  [facility] - Office  [phone]

## 2017-12-21 NOTE — Plan of Care (Signed)
Patient arrived to floor. Patient safety and floor staff/resources were dicussed and patient verbalized understanding. Patient progressing towards all goals. Will continue to monitor.

## 2017-12-31 ENCOUNTER — Institutional Professional Consult (permissible substitution): Payer: BLUE CROSS/BLUE SHIELD | Admitting: Pulmonary Disease

## 2018-01-04 ENCOUNTER — Institutional Professional Consult (permissible substitution): Payer: BLUE CROSS/BLUE SHIELD | Admitting: Pulmonary Disease

## 2018-01-04 NOTE — Progress Notes (Deleted)
Synopsis: Referred in *** for *** by Renaye Rakers, MD  Subjective:   PATIENT ID: Daniel Church GENDER: male DOB: 09-16-1995, MRN: [id number]  No chief complaint on file.   HPI  ***  Past Medical History:  Diagnosis Date  . ADHD (attention deficit hyperactivity disorder)   . Allergy   . Asthma   . Obesity      Family History  Problem Relation Age of Onset  . Hypertension Mother   . Diabetes Father   . Heart disease Father        CHF  . Irritable bowel syndrome Father   . Hyperlipidemia Father   . Hypertension Father   . Heart disease Maternal Uncle 47       MI  . Bipolar disorder Maternal Uncle   . Heart disease Maternal Uncle 51       MI  . Cancer Neg Hx      No past surgical history on file.  Social History   Socioeconomic History  . Marital status: Single    Spouse name: Not on file  . Number of children: Not on file  . Years of education: Not on file  . Highest education level: Not on file  Occupational History  . Not on file  Social Needs  . Financial resource strain: Not on file  . Food insecurity:    Worry: Not on file    Inability: Not on file  . Transportation needs:    Medical: Not on file    Non-medical: Not on file  Tobacco Use  . Smoking status: Never Smoker  . Smokeless tobacco: Never Used  Substance and Sexual Activity  . Alcohol use: No  . Drug use: Yes    Types: Marijuana  . Sexual activity: Not Currently  Lifestyle  . Physical activity:    Days per week: Not on file    Minutes per session: Not on file  . Stress: Not on file  Relationships  . Social connections:    Talks on phone: Not on file    Gets together: Not on file    Attends religious service: Not on file    Active member of club or organization: Not on file    Attends meetings of clubs or organizations: Not on file    Relationship status: Not on file  . Intimate partner violence:    Fear of current or ex partner: Not on file    Emotionally abused: Not on  file    Physically abused: Not on file    Forced sexual activity: Not on file  Other Topics Concern  . Not on file  Social History Narrative   Lives with parents.  1 dog. No tobacco exposure.  Has 2 older brothers     No Known Allergies   Outpatient Medications Prior to Visit  Medication Sig Dispense Refill  . albuterol (PROVENTIL HFA;VENTOLIN HFA) 108 (90 Base) MCG/ACT inhaler Inhale 2 puffs into the lungs every 6 (six) hours as needed for wheezing or shortness of breath. 1 Inhaler 0  . albuterol (PROVENTIL) (2.5 MG/3ML) 0.083% nebulizer solution Take 3 mLs (2.5 mg total) by nebulization every 4 (four) hours as needed for wheezing or shortness of breath. 30 vial 1  . esomeprazole (NEXIUM) 20 MG capsule Take 40 mg by mouth daily at 12 noon.     . fexofenadine (ALLEGRA) 180 MG tablet Take 1 tablet (180 mg total) by mouth daily. 30 tablet 3  . Fluticasone-Salmeterol (ADVAIR DISKUS) 100-50  MCG/DOSE AEPB Inhale 1 puff into the lungs 2 (two) times daily. For asthma 1 each 6  . guaiFENesin (MUCINEX) 600 MG 12 hr tablet Take 1 tablet (600 mg total) by mouth 2 (two) times daily as needed for cough. 40 tablet 2  . montelukast (SINGULAIR) 10 MG tablet Take 1 tablet (10 mg total) by mouth at bedtime. 30 tablet 3  . predniSONE (DELTASONE) 20 MG tablet TAke 40mg  for 3 days then 20mg  for 2days then STOP--always take with Food, repeat as indicated per ASTHMA action Plan (do not use > 2x a month) 30 tablet 2   No facility-administered medications prior to visit.     ROS   Objective:  Physical Exam   There were no vitals filed for this visit.   on *** LPM *** RA BMI Readings from Last 3 Encounters:  12/10/16 33.80 kg/m  12/09/16 35.24 kg/m  01/28/16 34.46 kg/m (98 %, Z= 2.16)*   * Growth percentiles are based on CDC (Boys, 2-20 Years) data.   Wt Readings from Last 3 Encounters:  12/10/16 215 lb 13.3 oz (97.9 kg)  12/09/16 225 lb (102.1 kg)  01/28/16 220 lb (99.8 kg) (97 %, Z= 1.87)*    * Growth percentiles are based on CDC (Boys, 2-20 Years) data.     CBC    Component Value Date/Time   WBC 11.9 (H) 12/21/2017 0507   RBC 5.25 12/21/2017 0507   HGB 15.7 12/21/2017 0507   HCT 47.3 12/21/2017 0507   PLT 312 12/21/2017 0507   MCV 90.1 12/21/2017 0507   MCH 29.9 12/21/2017 0507   MCHC 33.2 12/21/2017 0507   RDW 12.2 12/21/2017 0507   LYMPHSABS 0.4 (L) 12/21/2017 0507   MONOABS 0.2 12/21/2017 0507   EOSABS 0.0 12/21/2017 0507   BASOSABS 0.0 12/21/2017 0507    ***  Chest Imaging: ***  Pulmonary Functions Testing Results:  FeNO: ***  Pathology: ***  Echocardiogram: ***  Heart Catheterization: ***    Assessment & Plan:   No diagnosis found.  Discussion: ***   Current Outpatient Medications:  .  albuterol (PROVENTIL HFA;VENTOLIN HFA) 108 (90 Base) MCG/ACT inhaler, Inhale 2 puffs into the lungs every 6 (six) hours as needed for wheezing or shortness of breath., Disp: 1 Inhaler, Rfl: 0 .  albuterol (PROVENTIL) (2.5 MG/3ML) 0.083% nebulizer solution, Take 3 mLs (2.5 mg total) by nebulization every 4 (four) hours as needed for wheezing or shortness of breath., Disp: 30 vial, Rfl: 1 .  esomeprazole (NEXIUM) 20 MG capsule, Take 40 mg by mouth daily at 12 noon. , Disp: , Rfl:  .  fexofenadine (ALLEGRA) 180 MG tablet, Take 1 tablet (180 mg total) by mouth daily., Disp: 30 tablet, Rfl: 3 .  Fluticasone-Salmeterol (ADVAIR DISKUS) 100-50 MCG/DOSE AEPB, Inhale 1 puff into the lungs 2 (two) times daily. For asthma, Disp: 1 each, Rfl: 6 .  guaiFENesin (MUCINEX) 600 MG 12 hr tablet, Take 1 tablet (600 mg total) by mouth 2 (two) times daily as needed for cough., Disp: 40 tablet, Rfl: 2 .  montelukast (SINGULAIR) 10 MG tablet, Take 1 tablet (10 mg total) by mouth at bedtime., Disp: 30 tablet, Rfl: 3 .  predniSONE (DELTASONE) 20 MG tablet, TAke 40mg  for 3 days then 20mg  for 2days then STOP--always take with Food, repeat as indicated per ASTHMA action Plan (do not use >  2x a month), Disp: 30 tablet, Rfl: 2   Josephine IgoBradley L Icard, DO [facility] 01/04/2018 9:08 AM

## 2018-01-11 ENCOUNTER — Institutional Professional Consult (permissible substitution): Payer: BLUE CROSS/BLUE SHIELD | Admitting: Pulmonary Disease

## 2018-11-15 ENCOUNTER — Other Ambulatory Visit: Payer: Self-pay

## 2018-11-15 DIAGNOSIS — Z20822 Contact with and (suspected) exposure to covid-19: Secondary | ICD-10-CM

## 2018-11-17 LAB — NOVEL CORONAVIRUS, NAA: SARS-CoV-2, NAA: NOT DETECTED

## 2019-06-14 ENCOUNTER — Emergency Department (HOSPITAL_COMMUNITY)
Admission: EM | Admit: 2019-06-14 | Discharge: 2019-06-14 | Disposition: A | Discharge status: Home / Self Care | Payer: 59 | Attending: Emergency Medicine | Admitting: Emergency Medicine

## 2019-06-14 ENCOUNTER — Other Ambulatory Visit: Payer: Self-pay

## 2019-06-14 ENCOUNTER — Emergency Department (HOSPITAL_COMMUNITY): Payer: 59

## 2019-06-14 ENCOUNTER — Encounter (HOSPITAL_COMMUNITY): Payer: Self-pay | Admitting: Emergency Medicine

## 2019-06-14 DIAGNOSIS — J45901 Unspecified asthma with (acute) exacerbation: Secondary | ICD-10-CM

## 2019-06-14 DIAGNOSIS — F17200 Nicotine dependence, unspecified, uncomplicated: Secondary | ICD-10-CM | POA: Insufficient documentation

## 2019-06-14 DIAGNOSIS — Z79899 Other long term (current) drug therapy: Secondary | ICD-10-CM | POA: Diagnosis not present

## 2019-06-14 DIAGNOSIS — R0602 Shortness of breath: Secondary | ICD-10-CM | POA: Diagnosis present

## 2019-06-14 DIAGNOSIS — J4521 Mild intermittent asthma with (acute) exacerbation: Secondary | ICD-10-CM

## 2019-06-14 DIAGNOSIS — Z720 Tobacco use: Secondary | ICD-10-CM

## 2019-06-14 HISTORY — DX: Gastro-esophageal reflux disease without esophagitis: K21.9

## 2019-06-14 MED ORDER — AEROCHAMBER PLUS FLO-VU LARGE MISC
1.0000 | Freq: Once | Status: AC
  Administered 2019-06-14: 20:00:00 1

## 2019-06-14 MED ORDER — DEXAMETHASONE SODIUM PHOSPHATE 10 MG/ML IJ SOLN
10.0000 mg | Freq: Once | INTRAMUSCULAR | Status: AC
  Administered 2019-06-14: 10 mg via INTRAMUSCULAR
  Filled 2019-06-14: qty 1

## 2019-06-14 MED ORDER — PREDNISONE 10 MG (21) PO TBPK: ORAL_TABLET | ORAL | 0 refills | Status: DC

## 2019-06-14 NOTE — ED Triage Notes (Signed)
Patient reports increased issues with asthma over the past few days with the heat and slept with the fan on and believes that caused his asthma to worsen. Took nebulizer treatment this am and had minor relief. Reports has improved since arriving. Has inhaler with him.

## 2019-06-14 NOTE — ED Notes (Signed)
Pt education provided on use of AeroChamber, pt verbalized understanding, teach-back method utilized.

## 2019-06-14 NOTE — ED Provider Notes (Signed)
[facility] EMERGENCY DEPARTMENT Provider Note   CSN: [id number] Arrival date & time: 06/14/19  1618     History Chief Complaint  Patient presents with  . Asthma    Daniel Church is a 24 y.o. male.  Pt presents to the ED today with sob.  Pt has a hx of asthma.  He has been out of his meds for a week.  He said he slept in front of a fan last night.  He thinks that exacerbated things.  He did pick his meds up today and took them all.  He was still sob, so he came in.  Pt denies any f/c.  This is his typical asthma.  He does smoke.  Pt has had both doses of the covid vaccine.        Past Medical History:  Diagnosis Date  . ADHD (attention deficit hyperactivity disorder)   . Allergy   . Asthma   . GERD (gastroesophageal reflux disease)   . Obesity     Patient Active Problem List   Diagnosis Date Noted  . Asthma exacerbation 12/21/2017  . Acute asthma exacerbation 12/09/2016  . Obesity (BMI 35.0-39.9 without comorbidity) 12/09/2016  . Abnormal EKG 12/09/2016  . Vision changes 02/27/2011  . Obesity 02/27/2011  . Asthma 11/21/2010  . ADHD (attention deficit hyperactivity disorder) 11/21/2010  . Obesity (BMI 30-39.9) 11/21/2010    History reviewed. No pertinent surgical history.     Family History  Problem Relation Age of Onset  . Hypertension Mother   . Diabetes Father   . Heart disease Father        CHF  . Irritable bowel syndrome Father   . Hyperlipidemia Father   . Hypertension Father   . Heart disease Maternal Uncle 47       MI  . Bipolar disorder Maternal Uncle   . Heart disease Maternal Uncle 51       MI  . Cancer Neg Hx     Social History   Tobacco Use  . Smoking status: Never Smoker  . Smokeless tobacco: Never Used  Substance Use Topics  . Alcohol use: No  . Drug use: Yes    Types: Marijuana    Home Medications Prior to Admission medications   Medication Sig Start Date End Date Taking? Authorizing Provider    albuterol (PROVENTIL HFA;VENTOLIN HFA) 108 (90 Base) MCG/ACT inhaler Inhale 2 puffs into the lungs every 6 (six) hours as needed for wheezing or shortness of breath. 12/21/17  Yes Emokpae, Courage, MD  albuterol (PROVENTIL) (2.5 MG/3ML) 0.083% nebulizer solution Take 3 mLs (2.5 mg total) by nebulization every 4 (four) hours as needed for wheezing or shortness of breath. 12/21/17  Yes Emokpae, Courage, MD  esomeprazole (NEXIUM) 40 MG capsule Take 40 mg by mouth daily.    Yes [provider]  fexofenadine (ALLEGRA) 180 MG tablet Take 1 tablet (180 mg total) by mouth daily. 12/21/17  Yes Emokpae, Courage, MD  Fluticasone-Salmeterol (ADVAIR DISKUS) 100-50 MCG/DOSE AEPB Inhale 1 puff into the lungs 2 (two) times daily. For asthma 12/21/17 06/14/19 Yes Emokpae, Courage, MD  montelukast (SINGULAIR) 10 MG tablet Take 1 tablet (10 mg total) by mouth at bedtime. Patient taking differently: Take 10 mg by mouth daily.  12/21/17  Yes Shon Hale, MD  Multiple Vitamins-Minerals (EMERGEN-C IMMUNE PLUS) PACK Take 1 Package by mouth daily.   Yes [provider]  guaiFENesin (MUCINEX) 600 MG 12 hr tablet Take 1 tablet (600 mg total)  by mouth 2 (two) times daily as needed for cough. Patient not taking: Reported on 06/14/2019 12/21/17   Shon Hale, MD  predniSONE (STERAPRED UNI-PAK 21 TAB) 10 MG (21) TBPK tablet Take 6 tabs for 2 days, then 5 for 2 days, then 4 for 2 days, then 3 for 2 days, 2 for 2 days, then 1 for 2 days 06/14/19   Jacalyn Lefevre, MD    Allergies    Patient has no known allergies.  Review of Systems   Review of Systems  Respiratory: Positive for shortness of breath and wheezing.   All other systems reviewed and are negative.   Physical Exam Updated Vital Signs BP 124/84 (BP Location: Left Arm)   Pulse 91   Temp 98.3 F (36.8 C) (Oral)   Resp 17   Ht 5\' 8"  (1.727 m)   Wt 111.1 kg   SpO2 98%   BMI 37.25 kg/m   Physical Exam Vitals and nursing note reviewed.   Constitutional:      Appearance: Normal appearance. He is obese.  HENT:     Head: Normocephalic and atraumatic.     Right Ear: External ear normal.     Left Ear: External ear normal.     Nose: Nose normal.     Mouth/Throat:     Mouth: Mucous membranes are moist.     Pharynx: Oropharynx is clear.  Eyes:     Extraocular Movements: Extraocular movements intact.     Conjunctiva/sclera: Conjunctivae normal.     Pupils: Pupils are equal, round, and reactive to light.  Cardiovascular:     Rate and Rhythm: Normal rate and regular rhythm.     Pulses: Normal pulses.     Heart sounds: Normal heart sounds.  Pulmonary:     Effort: Pulmonary effort is normal.     Breath sounds: Wheezing present.  Abdominal:     General: Abdomen is flat. Bowel sounds are normal.     Palpations: Abdomen is soft.  Musculoskeletal:        General: Normal range of motion.     Cervical back: Normal range of motion and neck supple.  Skin:    General: Skin is warm.     Capillary Refill: Capillary refill takes less than 2 seconds.  Neurological:     General: No focal deficit present.     Mental Status: He is alert and oriented to person, place, and time.  Psychiatric:        Mood and Affect: Mood normal.        Behavior: Behavior normal.        Thought Content: Thought content normal.        Judgment: Judgment normal.     ED Results / Procedures / Treatments   Labs (all labs ordered are listed, but only abnormal results are displayed) Labs Reviewed - No data to display  EKG None  Radiology DG Chest 2 View  Result Date: 06/14/2019 CLINICAL DATA:  Asthma exacerbation EXAM: CHEST - 2 VIEW COMPARISON:  12/21/2017 FINDINGS: The heart size and mediastinal contours are within normal limits. Both lungs are clear. The visualized skeletal structures are unremarkable. IMPRESSION: No acute abnormality of the lungs. Electronically Signed   By: 13/01/2018 M.D.   On: 06/14/2019 17:54    Procedures Procedures  (including critical care time)  Medications Ordered in ED Medications  AeroChamber Plus Flo-Vu Large MISC 1 each (1 each Other Given 06/14/19 1958)  dexamethasone (DECADRON) injection 10 mg (10 mg Intramuscular  Given 06/14/19 1954)    ED Course  I have reviewed the triage vital signs and the nursing notes.  Pertinent labs & imaging results that were available during my care of the patient were reviewed by me and considered in my medical decision making (see chart for details).    MDM Rules/Calculators/A&P                       Pt given a spacer to use with his inhaler.  He is given decadron in ED.  He has all his meds now and does not need refills.  Pt is encouraged to stop smoking.  He is stable for d/c.  Return if worse.   Final Clinical Impression(s) / ED Diagnoses Final diagnoses:  Mild intermittent asthma with exacerbation  Tobacco abuse    Rx / DC Orders ED Discharge Orders         Ordered    predniSONE (STERAPRED UNI-PAK 21 TAB) 10 MG (21) TBPK tablet     06/14/19 1936           Isla Pence, MD 06/14/19 2011

## 2019-06-14 NOTE — ED Notes (Signed)
Patient verbalizes understanding of discharge instructions. Opportunity for questioning and answers were provided. Armband removed by staff, pt discharged from ED.  

## 2019-06-14 NOTE — Discharge Instructions (Signed)
Stop smoking

## 2020-02-14 ENCOUNTER — Other Ambulatory Visit: Payer: Self-pay

## 2020-02-14 ENCOUNTER — Other Ambulatory Visit: Payer: Self-pay | Admitting: Family Medicine

## 2020-02-14 ENCOUNTER — Ambulatory Visit
Admission: RE | Admit: 2020-02-14 | Discharge: 2020-02-14 | Disposition: A | Discharge status: Home / Self Care | Payer: 59 | Source: Ambulatory Visit | Attending: Family Medicine | Admitting: Family Medicine

## 2020-02-14 DIAGNOSIS — T1490XA Injury, unspecified, initial encounter: Secondary | ICD-10-CM

## 2020-12-22 ENCOUNTER — Other Ambulatory Visit: Payer: Self-pay

## 2020-12-22 ENCOUNTER — Emergency Department (HOSPITAL_COMMUNITY)
Admission: EM | Admit: 2020-12-22 | Discharge: 2020-12-23 | Disposition: A | Discharge status: Home / Self Care | Payer: 59 | Attending: Medical | Admitting: Medical

## 2020-12-22 ENCOUNTER — Emergency Department (HOSPITAL_COMMUNITY): Payer: 59

## 2020-12-22 DIAGNOSIS — R0602 Shortness of breath: Secondary | ICD-10-CM | POA: Insufficient documentation

## 2020-12-22 DIAGNOSIS — J45909 Unspecified asthma, uncomplicated: Secondary | ICD-10-CM | POA: Diagnosis not present

## 2020-12-22 DIAGNOSIS — R0789 Other chest pain: Secondary | ICD-10-CM | POA: Diagnosis not present

## 2020-12-22 DIAGNOSIS — Z5321 Procedure and treatment not carried out due to patient leaving prior to being seen by health care provider: Secondary | ICD-10-CM | POA: Diagnosis not present

## 2020-12-22 MED ORDER — PREDNISONE 20 MG PO TABS
60.0000 mg | ORAL_TABLET | Freq: Once | ORAL | Status: AC
  Administered 2020-12-22: 60 mg via ORAL
  Filled 2020-12-22: qty 3

## 2020-12-22 MED ORDER — ALBUTEROL SULFATE (2.5 MG/3ML) 0.083% IN NEBU: 10.0000 mg/h | INHALATION_SOLUTION | Freq: Once | RESPIRATORY_TRACT | Status: DC

## 2020-12-22 MED ORDER — IPRATROPIUM-ALBUTEROL 0.5-2.5 (3) MG/3ML IN SOLN
3.0000 mL | Freq: Once | RESPIRATORY_TRACT | Status: AC
  Administered 2020-12-22: 3 mL via RESPIRATORY_TRACT
  Filled 2020-12-22: qty 3

## 2020-12-22 NOTE — ED Triage Notes (Signed)
Pt c/o shortness of breath and chest tightness. Pt reports a history of asthma and he last used his inhaler about 30 minutes ago.

## 2020-12-22 NOTE — ED Provider Notes (Signed)
Emergency Medicine Provider Triage Evaluation Note  Daniel Church , a 25 y.o. male  was evaluated in triage.  Pt complains of asthma exacerbation that began around 5 PM tonight when he woke up from a nap.  He complains of chest tightness, shortness of breath, cough.  He states that he last uses rescue inhaler about 30 minutes ago prior to arrival.  He denies any fevers.  He states he has had to be admitted in the past for asthma exacerbation.  He has never had to be intubated.  Review of Systems  Positive: + wheezing, SOB, chest tightness, coug Negative: - fever  Physical Exam  BP (!) 155/92 (BP Location: Left Arm)   Pulse 83   Temp 98.9 F (37.2 C)   Resp 17   Ht 5\' 8"  (1.727 m)   Wt 113.4 kg   SpO2 99%   BMI 38.01 kg/m  Gen:   Awake, no distress   Resp:  Tachypneic. Speaking in shorter sentences. Coughing.  MSK:   Moves extremities without difficulty  Other:    Medical Decision Making  Medically screening exam initiated at 8:05 PM.  Appropriate orders placed.  Daniel Church was informed that the remainder of the evaluation will be completed by another provider, this initial triage assessment does not replace that evaluation, and the importance of remaining in the ED until their evaluation is complete.     Janetta Hora, PA-C 12/22/20 2005    12/24/20, MD 12/23/20 [phone]

## 2020-12-23 MED ORDER — ALBUTEROL SULFATE (2.5 MG/3ML) 0.083% IN NEBU
5.0000 mg | INHALATION_SOLUTION | Freq: Once | RESPIRATORY_TRACT | Status: AC
  Administered 2020-12-23: 5 mg via RESPIRATORY_TRACT
  Filled 2020-12-23: qty 6

## 2020-12-23 MED ORDER — IPRATROPIUM BROMIDE 0.02 % IN SOLN
0.5000 mg | Freq: Once | RESPIRATORY_TRACT | Status: AC
  Administered 2020-12-23: 0.5 mg via RESPIRATORY_TRACT
  Filled 2020-12-23: qty 2.5

## 2020-12-23 NOTE — ED Notes (Signed)
Patient left on own accord °

## 2021-02-19 DIAGNOSIS — R197 Diarrhea, unspecified: Secondary | ICD-10-CM | POA: Diagnosis not present

## 2021-03-29 DIAGNOSIS — J4531 Mild persistent asthma with (acute) exacerbation: Secondary | ICD-10-CM | POA: Diagnosis not present

## 2021-03-29 DIAGNOSIS — J4521 Mild intermittent asthma with (acute) exacerbation: Secondary | ICD-10-CM | POA: Diagnosis not present

## 2021-03-29 DIAGNOSIS — I10 Essential (primary) hypertension: Secondary | ICD-10-CM | POA: Diagnosis not present

## 2021-03-29 DIAGNOSIS — E6609 Other obesity due to excess calories: Secondary | ICD-10-CM | POA: Diagnosis not present

## 2021-03-29 DIAGNOSIS — Z833 Family history of diabetes mellitus: Secondary | ICD-10-CM | POA: Diagnosis not present

## 2021-03-29 DIAGNOSIS — K219 Gastro-esophageal reflux disease without esophagitis: Secondary | ICD-10-CM | POA: Diagnosis not present

## 2021-03-29 DIAGNOSIS — J4520 Mild intermittent asthma, uncomplicated: Secondary | ICD-10-CM | POA: Diagnosis not present

## 2021-03-29 DIAGNOSIS — R69 Illness, unspecified: Secondary | ICD-10-CM | POA: Diagnosis not present

## 2021-05-02 ENCOUNTER — Encounter (HOSPITAL_COMMUNITY): Payer: Self-pay

## 2021-05-02 ENCOUNTER — Emergency Department (HOSPITAL_COMMUNITY): Payer: Medicaid Other

## 2021-05-02 ENCOUNTER — Observation Stay (HOSPITAL_COMMUNITY)
Admission: EM | Admit: 2021-05-02 | Discharge: 2021-05-03 | Disposition: A | Discharge status: Home / Self Care | Payer: 59 | Attending: Family Medicine | Admitting: Family Medicine

## 2021-05-02 ENCOUNTER — Other Ambulatory Visit: Payer: Self-pay

## 2021-05-02 DIAGNOSIS — F17210 Nicotine dependence, cigarettes, uncomplicated: Secondary | ICD-10-CM | POA: Insufficient documentation

## 2021-05-02 DIAGNOSIS — E669 Obesity, unspecified: Secondary | ICD-10-CM | POA: Insufficient documentation

## 2021-05-02 DIAGNOSIS — J4541 Moderate persistent asthma with (acute) exacerbation: Principal | ICD-10-CM | POA: Insufficient documentation

## 2021-05-02 DIAGNOSIS — J4530 Mild persistent asthma, uncomplicated: Secondary | ICD-10-CM

## 2021-05-02 DIAGNOSIS — J45909 Unspecified asthma, uncomplicated: Secondary | ICD-10-CM | POA: Diagnosis present

## 2021-05-02 DIAGNOSIS — Z20822 Contact with and (suspected) exposure to covid-19: Secondary | ICD-10-CM | POA: Insufficient documentation

## 2021-05-02 DIAGNOSIS — Z6838 Body mass index (BMI) 38.0-38.9, adult: Secondary | ICD-10-CM | POA: Insufficient documentation

## 2021-05-02 DIAGNOSIS — K219 Gastro-esophageal reflux disease without esophagitis: Secondary | ICD-10-CM | POA: Diagnosis not present

## 2021-05-02 DIAGNOSIS — J45901 Unspecified asthma with (acute) exacerbation: Secondary | ICD-10-CM

## 2021-05-02 DIAGNOSIS — Z7951 Long term (current) use of inhaled steroids: Secondary | ICD-10-CM | POA: Insufficient documentation

## 2021-05-02 LAB — RESP PANEL BY RT-PCR (FLU A&B, COVID) ARPGX2
Influenza A by PCR: NEGATIVE
Influenza B by PCR: NEGATIVE
SARS Coronavirus 2 by RT PCR: NEGATIVE

## 2021-05-02 LAB — CBC WITH DIFFERENTIAL/PLATELET
Abs Immature Granulocytes: 0.03 10*3/uL (ref 0.00–0.07)
Basophils Absolute: 0.1 10*3/uL (ref 0.0–0.1)
Basophils Relative: 1 %
Eosinophils Absolute: 0.3 10*3/uL (ref 0.0–0.5)
Eosinophils Relative: 4 %
HCT: 48 % (ref 39.0–52.0)
Hemoglobin: 15.9 g/dL (ref 13.0–17.0)
Immature Granulocytes: 0 %
Lymphocytes Relative: 12 %
Lymphs Abs: 1.1 10*3/uL (ref 0.7–4.0)
MCH: 30.7 pg (ref 26.0–34.0)
MCHC: 33.1 g/dL (ref 30.0–36.0)
MCV: 92.7 fL (ref 80.0–100.0)
Monocytes Absolute: 0.4 10*3/uL (ref 0.1–1.0)
Monocytes Relative: 5 %
Neutro Abs: 6.8 10*3/uL (ref 1.7–7.7)
Neutrophils Relative %: 78 %
Platelets: 363 10*3/uL (ref 150–400)
RBC: 5.18 MIL/uL (ref 4.22–5.81)
RDW: 12.2 % (ref 11.5–15.5)
WBC: 8.6 10*3/uL (ref 4.0–10.5)
nRBC: 0 % (ref 0.0–0.2)

## 2021-05-02 LAB — BASIC METABOLIC PANEL
Anion gap: 8 (ref 5–15)
BUN: 11 mg/dL (ref 6–20)
CO2: 23 mmol/L (ref 22–32)
Calcium: 9.6 mg/dL (ref 8.9–10.3)
Chloride: 107 mmol/L (ref 98–111)
Creatinine, Ser: 1.18 mg/dL (ref 0.61–1.24)
GFR, Estimated: >60 mL/min (ref >60)
Glucose, Bld: 98 mg/dL (ref 70–99)
Potassium: 4.5 mmol/L (ref 3.5–5.1)
Sodium: 138 mmol/L (ref 135–145)

## 2021-05-02 MED ORDER — MONTELUKAST SODIUM 10 MG PO TABS
10.0000 mg | ORAL_TABLET | Freq: Every day | ORAL | Status: DC
  Administered 2021-05-02: 10 mg via ORAL
  Filled 2021-05-02 (×2): qty 1

## 2021-05-02 MED ORDER — PANTOPRAZOLE SODIUM 40 MG PO TBEC
40.0000 mg | DELAYED_RELEASE_TABLET | Freq: Two times a day (BID) | ORAL | Status: DC
  Administered 2021-05-02 – 2021-05-03 (×2): 40 mg via ORAL
  Filled 2021-05-02 (×2): qty 1

## 2021-05-02 MED ORDER — FLUTICASONE PROPIONATE 50 MCG/ACT NA SUSP: 2.0000 | Freq: Every day | NASAL | Status: DC | PRN

## 2021-05-02 MED ORDER — ALBUTEROL SULFATE (2.5 MG/3ML) 0.083% IN NEBU
10.0000 mg/h | INHALATION_SOLUTION | Freq: Once | RESPIRATORY_TRACT | Status: AC
  Administered 2021-05-02: 10 mg/h via RESPIRATORY_TRACT
  Filled 2021-05-02: qty 12

## 2021-05-02 MED ORDER — LORATADINE 10 MG PO TABS
10.0000 mg | ORAL_TABLET | Freq: Every day | ORAL | Status: DC
Start: 2021-05-02 — End: 2021-05-03
  Administered 2021-05-02 – 2021-05-03 (×2): 10 mg via ORAL
  Filled 2021-05-02 (×2): qty 1

## 2021-05-02 MED ORDER — METHYLPREDNISOLONE SODIUM SUCC 125 MG IJ SOLR
120.0000 mg | INTRAMUSCULAR | Status: DC
  Administered 2021-05-02: 120 mg via INTRAVENOUS
  Filled 2021-05-02: qty 2

## 2021-05-02 MED ORDER — METHYLPREDNISOLONE SODIUM SUCC 125 MG IJ SOLR
125.0000 mg | Freq: Once | INTRAMUSCULAR | Status: AC
  Administered 2021-05-02: 125 mg via INTRAVENOUS
  Filled 2021-05-02: qty 2

## 2021-05-02 MED ORDER — NICOTINE 14 MG/24HR TD PT24
14.0000 mg | MEDICATED_PATCH | Freq: Every day | TRANSDERMAL | Status: DC
  Administered 2021-05-02: 14 mg via TRANSDERMAL
  Filled 2021-05-02 (×2): qty 1

## 2021-05-02 MED ORDER — IPRATROPIUM-ALBUTEROL 0.5-2.5 (3) MG/3ML IN SOLN
3.0000 mL | Freq: Once | RESPIRATORY_TRACT | Status: AC
  Administered 2021-05-02: 3 mL via RESPIRATORY_TRACT
  Filled 2021-05-02: qty 3

## 2021-05-02 MED ORDER — ALBUTEROL SULFATE (2.5 MG/3ML) 0.083% IN NEBU
2.5000 mg | INHALATION_SOLUTION | RESPIRATORY_TRACT | Status: DC
  Administered 2021-05-02 – 2021-05-03 (×4): 2.5 mg via RESPIRATORY_TRACT
  Filled 2021-05-02 (×5): qty 3

## 2021-05-02 MED ORDER — MAGNESIUM SULFATE 2 GM/50ML IV SOLN
2.0000 g | Freq: Once | INTRAVENOUS | Status: AC
  Administered 2021-05-02: 2 g via INTRAVENOUS
  Filled 2021-05-02: qty 50

## 2021-05-02 MED ORDER — MOMETASONE FURO-FORMOTEROL FUM 100-5 MCG/ACT IN AERO
2.0000 | INHALATION_SPRAY | Freq: Two times a day (BID) | RESPIRATORY_TRACT | Status: DC
  Administered 2021-05-03: 2 via RESPIRATORY_TRACT
  Filled 2021-05-02: qty 8.8

## 2021-05-02 MED ORDER — ALBUTEROL SULFATE (2.5 MG/3ML) 0.083% IN NEBU
3.0000 mL | INHALATION_SOLUTION | Freq: Four times a day (QID) | RESPIRATORY_TRACT | Status: DC | PRN
  Administered 2021-05-02 – 2021-05-03 (×2): 3 mL via RESPIRATORY_TRACT

## 2021-05-02 NOTE — ED Triage Notes (Signed)
Pt arrived POV from home c/o an asthma attack. Pt states he tried a breathing treatment at home and his rescue inhaler but still feels like he cannot breathe.  ?

## 2021-05-02 NOTE — H&P (Signed)
Cigarette ?History and Physical  ? ? ?Amber Guthridge Tony ZOX:[id number] DOB: [birth year] DOA: 05/02/2021 ? ?PCP: Renaye Rakers, MD (Confirm with patient/family/NH records and if not entered, this has to be entered at [facility] point of entry) ?Patient coming from: Home ? ?I have personally briefly reviewed patient's old medical records in [facility] ? ?Chief Complaint: Wheezing, SOB ? ?HPI: Daniel Church is a 26 y.o. male with medical history significant of mild persistent asthma, GERD, obesity, presented with worsening of wheezing and shortness of breath. ? ?Symptoms started this morning, patient suddenly developed " asthma attack", with severe feeling of shortness of breath and wheezy.  Denies any cough, no fever or chills.  No recent new medications.  Patient has severe GERD which he reported as more or less related to his eating behavior, he likes to eat a large meal before he goes to bed.  And his PCP has increased his PPI to twice daily 2 months ago with some improvement.  However he still experienced frequent heartburns and sometimes after he lies down " food will come out of stomach to reach throat".  Patient has been using albuterol nebulizer machine 2 times a day continuously, and has had 1 flareup this year, no history of intubation. ? ?ED Course: Patient was found to be in severe asthma attack, was given Solu-Medrol magnesium, with some improvement but continued to have wheezy and diminished breathing sound. ? ?Review of Systems: As per HPI otherwise 14 point review of systems negative.  ? ? ?Past Medical History:  ?Diagnosis Date  ? ADHD (attention deficit hyperactivity disorder)   ? Allergy   ? Asthma   ? GERD (gastroesophageal reflux disease)   ? Obesity   ? ? ?History reviewed. No pertinent surgical history. ? ? reports that he has never smoked. He has never used smokeless tobacco. He reports current drug use. Drug: Marijuana. He reports that he does not drink alcohol. ? ?No Known  Allergies ? ?Family History  ?Problem Relation Age of Onset  ? Hypertension Mother   ? Diabetes Father   ? Heart disease Father   ?     CHF  ? Irritable bowel syndrome Father   ? Hyperlipidemia Father   ? Hypertension Father   ? Heart disease Maternal Uncle 47  ?     MI  ? Bipolar disorder Maternal Uncle   ? Heart disease Maternal Uncle 51  ?     MI  ? Cancer Neg Hx   ? ? ? ?Prior to Admission medications   ?Medication Sig Start Date End Date Taking? Authorizing Provider  ?albuterol (PROVENTIL HFA;VENTOLIN HFA) 108 (90 Base) MCG/ACT inhaler Inhale 2 puffs into the lungs every 6 (six) hours as needed for wheezing or shortness of breath. 12/21/17  Yes Emokpae, Courage, MD  ?albuterol (PROVENTIL) (2.5 MG/3ML) 0.083% nebulizer solution Take 3 mLs (2.5 mg total) by nebulization every 4 (four) hours as needed for wheezing or shortness of breath. 12/21/17  Yes Shon Hale, MD  ?esomeprazole (NEXIUM) 40 MG capsule Take 40 mg by mouth daily at 12 noon.   Yes [provider]  ?fexofenadine (ALLEGRA) 180 MG tablet Take 1 tablet (180 mg total) by mouth daily. 12/21/17  Yes Emokpae, Courage, MD  ?Fluticasone-Salmeterol (ADVAIR DISKUS) 100-50 MCG/DOSE AEPB Inhale 1 puff into the lungs 2 (two) times daily. For asthma 12/21/17 05/02/21 Yes Emokpae, Courage, MD  ?montelukast (SINGULAIR) 10 MG tablet Take 1 tablet (10 mg total) by mouth at bedtime. ?Patient taking differently:  Take 10 mg by mouth daily. 12/21/17  Yes Emokpae, Courage, MD  ?pantoprazole (PROTONIX) 20 MG tablet Take 20 mg by mouth daily.   Yes [provider]  ?fluticasone (FLONASE) 50 MCG/ACT nasal spray Place 2 sprays into both nostrils daily as needed for allergies or rhinitis.    [provider]  ? ? ?Physical Exam: ?Vitals:  ? 05/02/21 1124 05/02/21 1200 05/02/21 1245 05/02/21 1330  ?BP:  (!) 146/90 (!) 137/118 130/78  ?Pulse:  67 88 90  ?Resp:  20 16 (!) 23  ?Temp:      ?TempSrc:      ?SpO2:  100% 98% 98%  ?Weight: 113.4 kg      ?Height: 5\' 8"  (1.727 m)     ? ? ?Constitutional: NAD, calm, comfortable ?Vitals:  ? 05/02/21 1124 05/02/21 1200 05/02/21 1245 05/02/21 1330  ?BP:  (!) 146/90 (!) 137/118 130/78  ?Pulse:  67 88 90  ?Resp:  20 16 (!) 23  ?Temp:      ?TempSrc:      ?SpO2:  100% 98% 98%  ?Weight: 113.4 kg     ?Height: 5\' 8"  (1.727 m)     ? ?Eyes: PERRL, lids and conjunctivae normal ?ENMT: Mucous membranes are moist. Posterior pharynx clear of any exudate or lesions.Normal dentition. Rash in the back of the throat ?Neck: normal, supple, no masses, no thyromegaly ?Respiratory: Diminished breathing sound bilaterally, diffused wheezing, no crackles.  Increasing respiratory effort. No accessory muscle use.  ?Cardiovascular: Regular rate and rhythm, no murmurs / rubs / gallops. No extremity edema. 2+ pedal pulses. No carotid bruits.  ?Abdomen: no tenderness, no masses palpated. No hepatosplenomegaly. Bowel sounds positive.  ?Musculoskeletal: no clubbing / cyanosis. No joint deformity upper and lower extremities. Good ROM, no contractures. Normal muscle tone.  ?Skin: no rashes, lesions, ulcers. No induration ?Neurologic: CN 2-12 grossly intact. Sensation intact, DTR normal. Strength 5/5 in all 4.  ?Psychiatric: Normal judgment and insight. Alert and oriented x 3. Normal mood.  ? ? ? ?Labs on Admission: I have personally reviewed following labs and imaging studies ? ?CBC: ?Recent Labs  ?Lab 05/02/21 ?1154  ?WBC 8.6  ?NEUTROABS 6.8  ?HGB 15.9  ?HCT 48.0  ?MCV 92.7  ?PLT 363  ? ?Basic Metabolic Panel: ?Recent Labs  ?Lab 05/02/21 ?1154  ?NA 138  ?K 4.5  ?CL 107  ?CO2 23  ?GLUCOSE 98  ?BUN 11  ?CREATININE 1.18  ?CALCIUM 9.6  ? ?GFR: ?Estimated Creatinine Clearance: 118 mL/min (by C-G formula based on SCr of 1.18 mg/dL). ?Liver Function Tests: ?No results for input(s): AST, ALT, ALKPHOS, BILITOT, PROT, ALBUMIN in the last 168 hours. ?No results for input(s): LIPASE, AMYLASE in the last 168 hours. ?No results for input(s): AMMONIA in the last 168  hours. ?Coagulation Profile: ?No results for input(s): INR, PROTIME in the last 168 hours. ?Cardiac Enzymes: ?No results for input(s): CKTOTAL, CKMB, CKMBINDEX, TROPONINI in the last 168 hours. ?BNP (last 3 results) ?No results for input(s): PROBNP in the last 8760 hours. ?HbA1C: ?No results for input(s): HGBA1C in the last 72 hours. ?CBG: ?No results for input(s): GLUCAP in the last 168 hours. ?Lipid Profile: ?No results for input(s): CHOL, HDL, LDLCALC, TRIG, CHOLHDL, LDLDIRECT in the last 72 hours. ?Thyroid Function Tests: ?No results for input(s): TSH, T4TOTAL, FREET4, T3FREE, THYROIDAB in the last 72 hours. ?Anemia Panel: ?No results for input(s): VITAMINB12, FOLATE, FERRITIN, TIBC, IRON, RETICCTPCT in the last 72 hours. ?Urine analysis: ?   ?Component Value Date/Time  ?  BILIRUBINUR neg 09/20/2014 1319  ? PROTEINUR neg 09/20/2014 1319  ? UROBILINOGEN negative 09/20/2014 1319  ? NITRITE neg 09/20/2014 1319  ? LEUKOCYTESUR Negative 09/20/2014 1319  ? ? ?Radiological Exams on Admission: ?DG Chest Port 1 View ? ?Result Date: 05/02/2021 ?CLINICAL DATA:  Shortness of breath, asthma attack. EXAM: PORTABLE CHEST 1 VIEW COMPARISON:  December 22, 2020 FINDINGS: The heart size and mediastinal contours are within normal limits. Perihilar predominant interstitial opacities suggesting reactive airways disease in the appropriate clinical setting. No focal airspace consolidation. The visualized skeletal structures are unremarkable. IMPRESSION: Perihilar predominant interstitial opacities suggesting reactive airways disease in the appropriate clinical setting. Electronically Signed   By: Maudry MayhewJeffrey  Waltz M.D.   On: 05/02/2021 12:09   ? ?EKG: Independently reviewed.  Sinus, chronic ST changes on multiple leads including lead III and aVF, V1 to V6. ? ?Assessment/Plan ?Principal Problem: ?  Asthma ?Active Problems: ?  Obesity ?  GERD (gastroesophageal reflux disease) ? (please populate well all problems here in Problem List. (For  example, if patient is on BP meds at home and you resume or decide to hold them, it is a problem that needs to be her. Same for CAD, COPD, HLD and so on) ? ?Acute asthma exacerbation ?-Likely secondary to uncontrolled GERD as

## 2021-05-02 NOTE — ED Provider Notes (Signed)
?[facility] ?Provider Note ? ? ?CSN: [id number] ?Arrival date & time: 05/02/21  1045 ? ?  ? ?History ? ?Chief Complaint  ?Patient presents with  ? asthma attack   ? ? ?Daniel Church is a 26 y.o. male. ? ?Daniel Church is a 26 y.o. male with a history of asthma, allergies, obesity, GERD, ADHD, who presents to the emergency department for evaluation of shortness of breath, wheezing and cough.  He reports that symptoms started around 430 this morning when he woke up and he reports he has had persistent and worsening difficulty breathing since waking up this morning.  He reports this feels like asthma attacks he has had in the past.  He reports he has used his albuterol inhaler at home, but only feels like he gets very temporary relief and then symptoms worsen.  He reports he takes daily Advair but is not on any other medications to manage his asthma, does report that he currently smokes marijuana.  He reports with these symptoms he has had some cough but no congestion or fever and no known sick contacts.  He reports he does have to work outdoors and wonders if the allergy season or change in temperature has contributed to his symptoms as well.  He reports some tightness with breathing but no chest pain.  No lower extremity swelling, lightheadedness or syncope.  Reports he was referred to a pulmonologist but has not followed up with them and has had to come to the hospital several times in the past year for his asthma, he has required admissions previously but has never required intubation. ? ?The history is provided by the patient.  ? ?  ? ?Home Medications ?Prior to Admission medications   ?Medication Sig Start Date End Date Taking? Authorizing Provider  ?albuterol (PROVENTIL HFA;VENTOLIN HFA) 108 (90 Base) MCG/ACT inhaler Inhale 2 puffs into the lungs every 6 (six) hours as needed for wheezing or shortness of breath. 12/21/17  Yes Emokpae, Courage, MD  ?albuterol  (PROVENTIL) (2.5 MG/3ML) 0.083% nebulizer solution Take 3 mLs (2.5 mg total) by nebulization every 4 (four) hours as needed for wheezing or shortness of breath. 12/21/17  Yes Emokpae, Courage, MD  ?esomeprazole (NEXIUM) 40 MG capsule Take 40 mg by mouth daily.     [provider]  ?fexofenadine (ALLEGRA) 180 MG tablet Take 1 tablet (180 mg total) by mouth daily. 12/21/17   Shon HaleEmokpae, Courage, MD  ?Fluticasone-Salmeterol (ADVAIR DISKUS) 100-50 MCG/DOSE AEPB Inhale 1 puff into the lungs 2 (two) times daily. For asthma 12/21/17 06/14/19  Shon HaleEmokpae, Courage, MD  ?guaiFENesin (MUCINEX) 600 MG 12 hr tablet Take 1 tablet (600 mg total) by mouth 2 (two) times daily as needed for cough. ?Patient not taking: Reported on 06/14/2019 12/21/17   Shon HaleEmokpae, Courage, MD  ?montelukast (SINGULAIR) 10 MG tablet Take 1 tablet (10 mg total) by mouth at bedtime. ?Patient taking differently: Take 10 mg by mouth daily.  12/21/17   Shon HaleEmokpae, Courage, MD  ?Multiple Vitamins-Minerals (EMERGEN-C IMMUNE PLUS) PACK Take 1 Package by mouth daily.    [provider]  ?predniSONE (STERAPRED UNI-PAK 21 TAB) 10 MG (21) TBPK tablet Take 6 tabs for 2 days, then 5 for 2 days, then 4 for 2 days, then 3 for 2 days, 2 for 2 days, then 1 for 2 days 06/14/19   Jacalyn LefevreHaviland, Julie, MD  ?   ? ?Allergies    ?Patient has no known allergies.   ? ?Review of Systems   ?  Review of Systems  ?Constitutional:  Negative for chills and fever.  ?HENT:  Negative for congestion, rhinorrhea and sore throat.   ?Eyes:  Negative for visual disturbance.  ?Respiratory:  Positive for chest tightness, shortness of breath and wheezing. Negative for cough.   ?Cardiovascular:  Negative for chest pain.  ?Gastrointestinal:  Negative for abdominal pain, diarrhea, nausea and vomiting.  ?Musculoskeletal:  Negative for arthralgias and myalgias.  ?Skin:  Negative for color change and rash.  ?Neurological:  Negative for dizziness, syncope and light-headedness.  ? ?Physical Exam ?Updated Vital  Signs ?BP (!) 158/93 (BP Location: Left Arm)   Pulse 90   Temp 98.5 ?F (36.9 ?C) (Oral)   Resp (!) 24   Ht 5\' 8"  (1.727 m)   Wt 113.4 kg   SpO2 99%   BMI 38.01 kg/m?  ?Physical Exam ?Vitals and nursing note reviewed.  ?Constitutional:   ?   General: He is not in acute distress. ?   Appearance: Normal appearance. He is well-developed. He is not ill-appearing or diaphoretic.  ?HENT:  ?   Head: Normocephalic and atraumatic.  ?   Mouth/Throat:  ?   Mouth: Mucous membranes are moist.  ?   Pharynx: Oropharynx is clear.  ?Eyes:  ?   General:     ?   Right eye: No discharge.     ?   Left eye: No discharge.  ?Cardiovascular:  ?   Rate and Rhythm: Normal rate and regular rhythm.  ?   Pulses: Normal pulses.  ?   Heart sounds: Normal heart sounds.  ?Pulmonary:  ?   Effort: Tachypnea and accessory muscle usage present.  ?   Breath sounds: Decreased air movement present. Wheezing present. No rales.  ?   Comments: Pt with increased work of breathing and accessory muscle use, only able to speak in short sentences, still maintaining sats on room air, decreased air movement throughout with diffuse faint expiratory wheezing ?Abdominal:  ?   General: Bowel sounds are normal. There is no distension.  ?   Palpations: Abdomen is soft. There is no mass.  ?   Tenderness: There is no abdominal tenderness. There is no guarding.  ?   Comments: Abdomen soft, nondistended, nontender to palpation in all quadrants without guarding or peritoneal signs  ?Musculoskeletal:     ?   General: No deformity.  ?   Cervical back: Neck supple.  ?   Right lower leg: No edema.  ?   Left lower leg: No edema.  ?Skin: ?   General: Skin is warm and dry.  ?   Capillary Refill: Capillary refill takes less than 2 seconds.  ?Neurological:  ?   Mental Status: He is alert and oriented to person, place, and time.  ?   Coordination: Coordination normal.  ?   Comments: Speech is clear, able to follow commands ?Moves extremities without ataxia, coordination intact   ?Psychiatric:     ?   Mood and Affect: Mood normal.     ?   Behavior: Behavior normal.  ? ? ?ED Results / Procedures / Treatments   ?Labs ?(all labs ordered are listed, but only abnormal results are displayed) ?Labs Reviewed  ?RESP PANEL BY RT-PCR (FLU A&B, COVID) ARPGX2  ?BASIC METABOLIC PANEL  ?CBC WITH DIFFERENTIAL/PLATELET  ?HIV ANTIBODY (ROUTINE TESTING W REFLEX)  ? ? ?EKG ?EKG Interpretation ? ?Date/Time:  Friday May 02 2021 12:13:09 EDT ?Ventricular Rate:  73 ?PR Interval:  127 ?QRS Duration: 115 ?QT Interval:  355 ?QTC Calculation: 392 ?R Axis:   59 ?Text Interpretation: Sinus arrhythmia Incomplete left bundle branch block ST elevation, consider inferior injury rate, slower, but otherwise similar to prior EKG Confirmed by Rolan Bucco [phone]) on 05/03/2021 11:28:26 AM ? ?Radiology ?DG Chest Port 1 View ? ?Result Date: 05/02/2021 ?CLINICAL DATA:  Shortness of breath, asthma attack. EXAM: PORTABLE CHEST 1 VIEW COMPARISON:  December 22, 2020 FINDINGS: The heart size and mediastinal contours are within normal limits. Perihilar predominant interstitial opacities suggesting reactive airways disease in the appropriate clinical setting. No focal airspace consolidation. The visualized skeletal structures are unremarkable. IMPRESSION: Perihilar predominant interstitial opacities suggesting reactive airways disease in the appropriate clinical setting. Electronically Signed   By: Maudry Mayhew M.D.   On: 05/02/2021 12:09   ? ?Procedures ?Marland KitchenCritical Care ?Performed by: Dartha Lodge, PA-C ?Authorized by: Dartha Lodge, PA-C  ? ?Critical care provider statement:  ?  Critical care time (minutes):  30 ?  Critical care was necessary to treat or prevent imminent or life-threatening deterioration of the following conditions:  Respiratory failure (Asthma exacerbation requiring admission) ?  Critical care was time spent personally by me on the following activities:  Development of treatment plan with patient or surrogate,  discussions with consultants, evaluation of patient's response to treatment, examination of patient, ordering and review of laboratory studies, ordering and review of radiographic studies, ordering and performing t

## 2021-05-03 DIAGNOSIS — K219 Gastro-esophageal reflux disease without esophagitis: Secondary | ICD-10-CM | POA: Diagnosis not present

## 2021-05-03 DIAGNOSIS — J4541 Moderate persistent asthma with (acute) exacerbation: Secondary | ICD-10-CM

## 2021-05-03 LAB — HIV ANTIBODY (ROUTINE TESTING W REFLEX): HIV Screen 4th Generation wRfx: NONREACTIVE

## 2021-05-03 MED ORDER — PREDNISONE 20 MG PO TABS: 40.0000 mg | ORAL_TABLET | Freq: Every day | ORAL | 0 refills | Status: AC

## 2021-05-03 MED ORDER — ACETAMINOPHEN 500 MG PO TABS
1000.0000 mg | ORAL_TABLET | Freq: Once | ORAL | Status: AC
Start: 2021-05-03 — End: 2021-05-03
  Administered 2021-05-03: 1000 mg via ORAL
  Filled 2021-05-03: qty 2

## 2021-05-03 NOTE — Discharge Summary (Signed)
?Physician Discharge Summary ?  ?Patient: Daniel Church MRN: [id number] DOB: 1995-09-05  ?Admit date:     05/02/2021  ?Discharge date: 05/03/21  ?Discharge Physician: Alberteen SamChristopher P Jem Castro  ? ?PCP: Renaye RakersBland, Veita, MD  ? ?Recommendations at discharge:  ?Follow up with Dr. Parke SimmersBland in 1 week ? ? ? ? ? ?Discharge Diagnoses: ?Principal Problem: ?  Asthma ?Active Problems: ?  Obesity ?  GERD (gastroesophageal reflux disease) ?  ? ? ? ? ?Hospital Course: ?Mr. Milne is a 26 y.o. M with moderate persistent Asthma, obesity BMI 38, and GERD who presented with a few days progressive asthma symptoms, finally on day of admission was severely dyspneic, wheezing. ? ?In the ER, CXR clear. Very wheezy, started on magnesium, Solu-medrol and albuterol and admitted. ? ?Overnight, much improved.  Still a little wheezy on exam, but symptoms resolved, ambulated without desaturation, taking PO well. ? ?Discharged with prednisone taper, albuterol refills and close PCP follow up. ? ? ? ?  ? ? ? ?  ? ? ?Disposition: Home ?Diet recommendation:  ?Discharge Diet Orders (From admission, onward)  ? ?  Start     Ordered  ? 05/03/21 0000  Diet - low sodium heart healthy       ? 05/03/21 1145  ? ?  ?  ? ?  ? ? ?DISCHARGE MEDICATION: ?Allergies as of 05/03/2021   ?No Known Allergies ?  ? ?  ?Medication List  ?  ? ?STOP taking these medications   ? ?esomeprazole 40 MG capsule ?Commonly known as: NEXIUM ?  ? ?  ? ?TAKE these medications   ? ?albuterol 108 (90 Base) MCG/ACT inhaler ?Commonly known as: VENTOLIN HFA ?Inhale 2 puffs into the lungs every 6 (six) hours as needed for wheezing or shortness of breath. ?  ?albuterol (2.5 MG/3ML) 0.083% nebulizer solution ?Commonly known as: PROVENTIL ?Take 3 mLs (2.5 mg total) by nebulization every 4 (four) hours as needed for wheezing or shortness of breath. ?  ?fexofenadine 180 MG tablet ?Commonly known as: ALLEGRA ?Take 1 tablet (180 mg total) by mouth daily. ?  ?fluticasone 50 MCG/ACT nasal spray ?Commonly  known as: FLONASE ?Place 2 sprays into both nostrils daily as needed for allergies or rhinitis. ?  ?Fluticasone-Salmeterol 100-50 MCG/DOSE Aepb ?Commonly known as: Advair Diskus ?Inhale 1 puff into the lungs 2 (two) times daily. For asthma ?  ?montelukast 10 MG tablet ?Commonly known as: SINGULAIR ?Take 1 tablet (10 mg total) by mouth at bedtime. ?What changed: when to take this ?  ?pantoprazole 20 MG tablet ?Commonly known as: PROTONIX ?Take 20 mg by mouth daily. ?  ?predniSONE 20 MG tablet ?Commonly known as: DELTASONE ?Take 2 tablets (40 mg total) by mouth daily with breakfast for 4 days. ?  ? ?  ? ? Follow-up Information   ? ? Renaye RakersBland, Veita, MD. Schedule an appointment as soon as possible for a visit in 1 week(s).   ?Specialty: Family Medicine ?Contact information: ?[address] ?[address] ?[place] [address] ?[phone] ? ? ?  ?  ? ?  ?  ? ?  ? ?Discharge Instructions   ? ? Diet - low sodium heart healthy   Complete by: As directed ?  ? Discharge instructions   Complete by: As directed ?  ? From Dr. Maryfrances Bunnellanford: ?You were admitted for an asthma flare. ?I'm assuming this was triggered by working outside, during tree pollen season. ? ?For the next week: ?Take prednisone 40 mg (two tabs) once daily in  the morning starting Sunday ?Continue your Singulair, Allegra, and Flonase and pantoprazole  ?While you are taking the prednisone, use your albuterol 3-4 times daily ?While you are taking the prednisone, it's probably okay to hold off on taking Advair or Dulera ? ?On Wednesday night, go back to taking your Advair ?If you'd like to try it, you can use the [facility] instead of the Advair, until the [place] runs out ?Once you finish the prednisone, you can reduce your albuterol to "as needed" use ?Hopefully, you don't need it at all, but you should let Dr. Parke Simmers know how often you are needing it, because that will help her know how to adjust your Advair ? ? ?No smoking. ?Pantoprazole and "esomeprazole/Nexium" are equivalent, so  don't take both at the same time.  It's not harmful it's just not useful. ?Instead, if you take one or the other, and still have heartburn, add a "H2 receptor antagonist" like ranitidine/Zantac or famotidine/Pepcid ? ?Go see Dr. Parke Simmers in 1 week  ? Increase activity slowly   Complete by: As directed ?  ? ?  ? ? ?Discharge Exam: ?Filed Weights  ? 05/02/21 1124  ?Weight: 113.4 kg  ? ?General: Pt is alert, awake, not in acute distress ?Cardiovascular: RRR, nl S1-S2, no murmurs appreciated.   No LE edema.   ?Respiratory: Normal respiratory rate and rhythm.  Mild wheezing bilaterally. ?Abdominal: Abdomen soft and non-tender.  No distension or HSM.   ?Neuro/Psych: Strength symmetric in upper and lower extremities.  Judgment and insight appear normal. ? ? ?Condition at discharge: good ? ?The results of significant diagnostics from this hospitalization (including imaging, microbiology, ancillary and laboratory) are listed below for reference.  ? ?Imaging Studies: ?DG Chest Port 1 View ? ?Result Date: 05/02/2021 ?CLINICAL DATA:  Shortness of breath, asthma attack. EXAM: PORTABLE CHEST 1 VIEW COMPARISON:  December 22, 2020 FINDINGS: The heart size and mediastinal contours are within normal limits. Perihilar predominant interstitial opacities suggesting reactive airways disease in the appropriate clinical setting. No focal airspace consolidation. The visualized skeletal structures are unremarkable. IMPRESSION: Perihilar predominant interstitial opacities suggesting reactive airways disease in the appropriate clinical setting. Electronically Signed   By: Maudry Mayhew M.D.   On: 05/02/2021 12:09   ? ?Microbiology: ?Results for orders placed or performed during the hospital encounter of 05/02/21  ?Resp Panel by RT-PCR (Flu A&B, Covid) Nasopharyngeal Swab     Status: None  ? Collection Time: 05/02/21 12:05 PM  ? Specimen: Nasopharyngeal Swab; Nasopharyngeal(NP) swabs in vial transport medium  ?Result Value Ref Range Status  ? SARS  Coronavirus 2 by RT PCR NEGATIVE NEGATIVE Final  ?  Comment: (NOTE) ?SARS-CoV-2 target nucleic acids are NOT DETECTED. ? ?The SARS-CoV-2 RNA is generally detectable in upper respiratory ?specimens during the acute phase of infection. The lowest ?concentration of SARS-CoV-2 viral copies this assay can detect is ?138 copies/mL. A negative result does not preclude SARS-Cov-2 ?infection and should not be used as the sole basis for treatment or ?other patient management decisions. A negative result may occur with  ?improper specimen collection/handling, submission of specimen other ?than nasopharyngeal swab, presence of viral mutation(s) within the ?areas targeted by this assay, and inadequate number of viral ?copies(<138 copies/mL). A negative result must be combined with ?clinical observations, patient history, and epidemiological ?information. The expected result is Negative. ? ?Fact Sheet for Patients:  ?BloggerCourse.com ? ?Fact Sheet for Healthcare Providers:  ?SeriousBroker.it ? ?This test is no t yet approved or cleared by the [place]  FDA and  ?has been authorized for detection and/or diagnosis of SARS-CoV-2 by ?FDA under an Emergency Use Authorization (EUA). This EUA will remain  ?in effect (meaning this test can be used) for the duration of the ?COVID-19 declaration under Section 564(b)(1) of the Act, 21 ?U.S.C.section 360bbb-3(b)(1), unless the authorization is terminated  ?or revoked sooner.  ? ? ?  ? Influenza A by PCR NEGATIVE NEGATIVE Final  ? Influenza B by PCR NEGATIVE NEGATIVE Final  ?  Comment: (NOTE) ?The Xpert Xpress SARS-CoV-2/FLU/RSV plus assay is intended as an aid ?in the diagnosis of influenza from Nasopharyngeal swab specimens and ?should not be used as a sole basis for treatment. Nasal washings and ?aspirates are unacceptable for Xpert Xpress SARS-CoV-2/FLU/RSV ?testing. ? ?Fact Sheet for  Patients: ?BloggerCourse.com ? ?Fact Sheet for Healthcare Providers: ?SeriousBroker.it ? ?This test is not yet approved or cleared by the [place] and ?has been authorized for detection and/or

## 2021-05-03 NOTE — Progress Notes (Signed)
Pt discharged home in stable condition 

## 2021-05-26 DIAGNOSIS — J45901 Unspecified asthma with (acute) exacerbation: Secondary | ICD-10-CM | POA: Diagnosis not present

## 2021-05-26 DIAGNOSIS — K219 Gastro-esophageal reflux disease without esophagitis: Secondary | ICD-10-CM | POA: Diagnosis not present

## 2021-07-17 ENCOUNTER — Encounter: Payer: Self-pay | Admitting: Internal Medicine

## 2021-07-17 ENCOUNTER — Ambulatory Visit (INDEPENDENT_AMBULATORY_CARE_PROVIDER_SITE_OTHER): Payer: 59 | Admitting: Internal Medicine

## 2021-07-17 VITALS — BP 120/70 | HR 60 | Temp 98.2°F | Ht 68.0 in | Wt 253.0 lb

## 2021-07-17 DIAGNOSIS — F17210 Nicotine dependence, cigarettes, uncomplicated: Secondary | ICD-10-CM | POA: Diagnosis not present

## 2021-07-17 DIAGNOSIS — K219 Gastro-esophageal reflux disease without esophagitis: Secondary | ICD-10-CM

## 2021-07-17 DIAGNOSIS — J301 Allergic rhinitis due to pollen: Secondary | ICD-10-CM | POA: Diagnosis not present

## 2021-07-17 DIAGNOSIS — R69 Illness, unspecified: Secondary | ICD-10-CM | POA: Diagnosis not present

## 2021-07-17 DIAGNOSIS — Z720 Tobacco use: Secondary | ICD-10-CM

## 2021-07-17 DIAGNOSIS — J4541 Moderate persistent asthma with (acute) exacerbation: Secondary | ICD-10-CM

## 2021-07-17 LAB — POCT EXHALED NITRIC OXIDE: FeNO level (ppb): 14

## 2021-07-17 NOTE — Patient Instructions (Addendum)
Please schedule follow up scheduled with myself in 3  months.  If my schedule is not open yet, we will contact you with a reminder closer to that time. Please call [phone] if you haven't heard from [place] a month before.   Keep taking dulera and albuterol Keep taking allergy meds - switch allegra to claritin or zyrtec. Keep taking singulair. Add flonase.  Flonase - 1 spray on each side of your nose twice a day for first week, then 1 spray on each side.   Instructions for use: If you also use a saline nasal spray or rinse, use that first. Position the head with the chin slightly tucked. Use the right hand to spray into the left nostril and the right hand to spray into the left nostril.   Point the bottle away from the septum of your nose (cartilage that divides the two sides of your nose).  Hold the nostril closed on the opposite side from where you will spray Spray once and gently sniff to pull the medicine into the higher parts of your nose.  Don't sniff too hard as the medicine will drain down the back of your throat instead. Repeat with a second spray on the same side if prescribed. Repeat on the other side of your nose.   What is GERD? Gastroesophageal reflux disease (GERD) is gastroesophageal reflux diseasewhich occurs when the lower esophageal sphincter (LES) opens spontaneously, for varying periods of time, or does not close properly and stomach contents rise up into the esophagus. GER is also called acid reflux or acid regurgitation, because digestive juices--called acids--rise up with the food. The esophagus is the tube that carries food from the mouth to the stomach. The LES is a ring of muscle at the bottom of the esophagus that acts like a valve between the esophagus and stomach.  When acid reflux occurs, food or fluid can be tasted in the back of the mouth. When refluxed stomach acid touches the lining of the esophagus it may cause a burning sensation in the chest or throat called  heartburn or acid indigestion. Occasional reflux is common. Persistent reflux that occurs more than twice a week is considered GERD, and it can eventually lead to more serious health problems. People of all ages can have GERD. Studies have shown that GERD may worsen or contribute to asthma, chronic cough, and pulmonary fibrosis.   What are the symptoms of GERD? The main symptom of GERD in adults is frequent heartburn, also called acid indigestion--burning-type pain in the lower part of the mid-chest, behind the breast bone, and in the mid-abdomen.  Not all reflux is acidic in nature, and many patients don't have heart burn at all. Sometimes it feels like a cough (either dry or with mucus), choking sensation, asthma, shortness of breath, waking up at night, frequent throat clearing, or trouble swallowing.    What causes GERD? The reason some people develop GERD is still unclear. However, research shows that in people with GERD, the LES relaxes while the rest of the esophagus is working. Anatomical abnormalities such as a hiatal hernia may also contribute to GERD. A hiatal hernia occurs when the upper part of the stomach and the LES move above the diaphragm, the muscle wall that separates the stomach from the chest. Normally, the diaphragm helps the LES keep acid from rising up into the esophagus. When a hiatal hernia is present, acid reflux can occur more easily. A hiatal hernia can occur in people of any  age and is most often a normal finding in otherwise healthy people over age 29. Most of the time, a hiatal hernia produces no symptoms.   Other factors that may contribute to GERD include - Obesity or recent weight gain - Pregnancy  - Smoking  - Diet - Certain medications  Common foods that can worsen reflux symptoms include: - carbonated beverages - artificial sweeteners - citrus fruits  - chocolate  - drinks with caffeine or alcohol  - fatty and fried foods  - garlic and onions  - mint  flavorings  - spicy foods  - tomato-based foods, like spaghetti sauce, salsa, chili, and pizza   Lifestyle Changes If you smoke, stop.  Avoid foods and beverages that worsen symptoms (see above.) Lose weight if needed.  Eat small, frequent meals.  Wear loose-fitting clothes.  Avoid lying down for 3 hours after a meal.  Raise the head of your bed 6 to 8 inches by securing wood blocks under the bedposts. Just using extra pillows will not help, but using a wedge-shaped pillow may be helpful.  Medications  H2 blockers, such as cimetidine (Tagamet HB), famotidine (Pepcid AC), nizatidine (Axid AR), and ranitidine (Zantac 75), decrease acid production. They are available in prescription strength and over-the-counter strength. These drugs provide short-term relief and are effective for about half of those who have GERD symptoms.  Proton pump inhibitors include omeprazole (Prilosec, Zegerid), lansoprazole (Prevacid), pantoprazole (Protonix), rabeprazole (Aciphex), and esomeprazole (Nexium), which are available by prescription. Prilosec is also available in over-the-counter strength. Proton pump inhibitors are more effective than H2 blockers and can relieve symptoms and heal the esophageal lining in almost everyone who has GERD.  Because drugs work in different ways, combinations of medications may help control symptoms. People who get heartburn after eating may take both antacids and H2 blockers. The antacids work first to neutralize the acid in the stomach, and then the H2 blockers act on acid production. By the time the antacid stops working, the H2 blocker will have stopped acid production. Your health care provider is the best source of information about how to use medications for GERD.   Points to Remember 1. You can have GERD without having heartburn. Your symptoms could include a dry cough, asthma symptoms, or trouble swallowing.  2. Taking medications daily as prescribed is important in  controlling you symptoms.  Sometimes it can take up to 8 weeks to fully achieve the effects of the medications prescribed.  3. Coughing related to GERD can be difficult to treat and is very frustrating!  However, it is important to stick with these medications and lifestyle modifications before pursuing more aggressive or invasive test and treatments.

## 2021-07-17 NOTE — Progress Notes (Signed)
Daniel Church    [id number]    [birth year]  Primary Care Physician:Bland, Myra Rude, MD  Referring Physician: Lucianne Lei, [address] [place],  [place] [address] Reason for Consultation: asthma Date of Consultation: 07/17/2021  Chief complaint:   Chief Complaint  Patient presents with   Consult    Pt is here for consult for asthma. Has had it since birth. Pt is currently on Dulera daily and Albuterol as needed. Pt states he feels like Ruthe Mannan is helping. ACT is 16. Symptoms are coughing, wheezing, and SOB when it flares up.      HPI: Daniel Church is a 27 y.o. man who presents for new patient evaluation of asthma. Has had lifelong asthma since childhood. He notes symptoms have worsened in the past 5-6 years since he started smoking.   Current albuterol use is 4-5 times/week.   Current Regimen: dulera (unclear the dose)and albuterol inhaler and nebs.  Asthma Triggers: smoking, reflux, exercise, changes in temperature, pollen.  Exacerbations in the last year: 2 requiring steroids.  History of hospitalization or intubation: yes has been hospitalized multiple times, last in March 2023 at [facility].  Allergy Testing: yes, was done as a child - reaction to dogs, grass GERD: yes, takes protonix 20 mg daily Allergic Rhinitis: yes, takes allegra, montelukast. Doesn't take the flonase ACT:  Asthma Control Test ACT Total Score  07/17/2021  9:46 AM 16   FeNO:  He smokes about once a day now. Has switched to edibles.   Jerrye Bushy is also waking him  up at night when he eats a heavy meal and triggers asthma to the point of requiring albuterol inhaler. He has started with a wedge pillow which has made a difference. Also has a bed he can raise the head of the bed.   He has also been eating late at night and been gaining weight.    Social history:  Occupation: works in a Education administrator Exposures: lives at home with parents, no passive smoke exposure. No pets Smoking  history: he smokes weed and its inside a tobacco cigar or a bong. Used to smoke black and milds.   Social History   Occupational History   Not on file  Tobacco Use   Smoking status: Never   Smokeless tobacco: Never   Tobacco comments:    Pt does not some cigarettes. ALS 07/17/2021  Vaping Use   Vaping Use: Never used  Substance and Sexual Activity   Alcohol use: No   Drug use: Yes    Types: Marijuana   Sexual activity: Not Currently    Relevant family history: Family History  Problem Relation Age of Onset   Hypertension Mother    Diabetes Father    Heart disease Father        CHF   Irritable bowel syndrome Father    Hyperlipidemia Father    Hypertension Father    Heart disease Maternal Uncle 57       MI   Bipolar disorder Maternal Uncle    Heart disease Maternal Uncle 74       MI   Cancer Neg Hx     Past Medical History:  Diagnosis Date   ADHD (attention deficit hyperactivity disorder)    Allergy    Asthma    GERD (gastroesophageal reflux disease)    Obesity     No past surgical history on file.   Physical Exam: Blood pressure 120/70, pulse  60, temperature 98.2 F (36.8 C), temperature source Oral, height _0  (1.727 m), weight 253 lb (114.8 kg), SpO2 97 %. Gen:      No acute distress, obese ENT:  no nasal polyps, mucus membranes moist Lungs:    No increased respiratory effort, symmetric chest wall excursion, clear to auscultation bilaterally, no wheezes or crackles CV:         Regular rate and rhythm; no murmurs, rubs, or gallops.  No pedal edema Abd:      + bowel sounds; soft, non-tender; no distension MSK: no acute synovitis of DIP or PIP joints, no mechanics hands.  Skin:      Warm and dry; no rashes Neuro: normal speech, no focal facial asymmetry Psych: alert and oriented x3, normal mood and affect   Data Reviewed/Medical Decision Making:  Independent interpretation of tests: Imaging:  Review of patient's chest xray March 2023 images revealed no  acute process. The patient's images have been independently reviewed by me.    PFTs: I have personally reviewed the patient's PFTs and spirometry shows mild airflow limitation. Feno not elevated      No data to display          Labs:  Lab Results  Component Value Date   WBC 8.6 05/02/2021   HGB 15.9 05/02/2021   HCT 48.0 05/02/2021   MCV 92.7 05/02/2021   PLT 363 05/02/2021   Lab Results  Component Value Date   NA 138 05/02/2021   K 4.5 05/02/2021   CL 107 05/02/2021   CO2 23 05/02/2021   No peripheral eosinophilia   Immunization status:  Immunization History  Administered Date(s) Administered   DTaP 08/04/1996, 10/04/1996, 12/04/1996, 10/04/1997, 08/30/2000   HPV Quadrivalent 12/07/2011, 02/08/2012, 11/11/2012, 10/25/2013   Hepatitis A 12/07/2011   Hepatitis A, Ped/Adol-2 Dose 10/25/2013   Hepatitis B 12/18/1996, 08/04/1996, 06/01/1997   IPV 08/04/1996, 10/04/1996, 06/06/1997, 08/30/2000   Influenza Split 11/04/2007, 11/21/2010, 12/07/2011   Influenza,inj,Quad PF,6+ Mos 11/11/2012, 10/25/2013   MMR 06/06/1997, 08/30/2000   Meningococcal Conjugate 12/07/2011, 12/07/2011, 10/25/2013   Tdap 05/09/2007   Varicella 12/14/1997, 02/08/2012     I reviewed prior external note(s) from hospital stay  I reviewed the result(s) of the labs and imaging as noted above.   I have ordered spiro and feno   Assessment:  Moderate persistent Asthma, not well controlled Allergic rhinitis, not well controlled Jerrye Bushy, not well controlled Obesity Body mass index is 38.47 kg/m. Tobacco use disorder   Plan/Recommendations:  Will add flonase for rhinitis, change allegra to cetirizine.  Reviewed PFTs with him which confirm asthma.  Continue dulera for now given feno not elevated.  Stress importance of reflux management for asthma control as well as allergies.  Gerd lifestyle modification discussed.  Discussed smoking cessation and switching to edibles instead of smoking cigars  with weed. He will consider this and has already started cutting back. Also discussed weight gain and it's impact on reflux and asthma. His night time smoking and eating during "munchies" is impairing his progress.   We discussed disease management and progression at length today regarding asthma management.   Smoking Cessation Counseling:  1. The patient is an everyday smoker and symptomatic due to the following condition asthma 2. The patient is currently contemplative in quitting smoking. 3. I advised patient to quit smoking. 4. We identified patient specific barriers to change.  5. I personally spent 3 minutes counseling the patient regarding tobacco use disorder. 6. We discussed management of stress and  anxiety to help with smoking cessation, when applicable. 7. We discussed nicotine replacement therapy, Wellbutrin, Chantix as possible options. 8. I advised setting a quit date. 9. Follow?up arranged with our office to continue ongoing discussions. 10.Resources given to patient including quit hotline.    Return to Care: Return in about 3 months (around 10/17/2021).  Lenice Llamas, MD Pulmonary and Critical Care Medicine [place]  CC: Lucianne Lei, MD

## 2021-08-11 DIAGNOSIS — J45901 Unspecified asthma with (acute) exacerbation: Secondary | ICD-10-CM | POA: Diagnosis not present

## 2021-08-11 DIAGNOSIS — R69 Illness, unspecified: Secondary | ICD-10-CM | POA: Diagnosis not present

## 2021-10-14 DIAGNOSIS — Z0131 Encounter for examination of blood pressure with abnormal findings: Secondary | ICD-10-CM | POA: Diagnosis not present

## 2021-10-14 DIAGNOSIS — R600 Localized edema: Secondary | ICD-10-CM | POA: Diagnosis not present

## 2021-10-20 ENCOUNTER — Ambulatory Visit: Payer: 59 | Admitting: Internal Medicine

## 2021-11-17 DIAGNOSIS — R600 Localized edema: Secondary | ICD-10-CM | POA: Diagnosis not present

## 2021-11-17 DIAGNOSIS — R030 Elevated blood-pressure reading, without diagnosis of hypertension: Secondary | ICD-10-CM | POA: Diagnosis not present

## 2021-11-17 DIAGNOSIS — J45909 Unspecified asthma, uncomplicated: Secondary | ICD-10-CM | POA: Diagnosis not present

## 2021-12-18 ENCOUNTER — Ambulatory Visit: Payer: 59 | Admitting: Dietician

## 2022-03-30 DIAGNOSIS — K219 Gastro-esophageal reflux disease without esophagitis: Secondary | ICD-10-CM | POA: Diagnosis not present

## 2022-03-30 DIAGNOSIS — E6609 Other obesity due to excess calories: Secondary | ICD-10-CM | POA: Diagnosis not present

## 2022-03-30 DIAGNOSIS — J45909 Unspecified asthma, uncomplicated: Secondary | ICD-10-CM | POA: Diagnosis not present

## 2022-03-30 DIAGNOSIS — J4520 Mild intermittent asthma, uncomplicated: Secondary | ICD-10-CM | POA: Diagnosis not present

## 2022-05-22 DIAGNOSIS — J45909 Unspecified asthma, uncomplicated: Secondary | ICD-10-CM | POA: Diagnosis not present

## 2022-05-22 DIAGNOSIS — R0683 Snoring: Secondary | ICD-10-CM | POA: Diagnosis not present

## 2022-05-22 DIAGNOSIS — Z833 Family history of diabetes mellitus: Secondary | ICD-10-CM | POA: Diagnosis not present

## 2022-05-22 DIAGNOSIS — Z6841 Body Mass Index (BMI) 40.0 and over, adult: Secondary | ICD-10-CM | POA: Diagnosis not present

## 2022-05-22 DIAGNOSIS — E6609 Other obesity due to excess calories: Secondary | ICD-10-CM | POA: Diagnosis not present

## 2022-05-22 DIAGNOSIS — J060 Acute laryngopharyngitis: Secondary | ICD-10-CM | POA: Diagnosis not present

## 2022-06-11 DIAGNOSIS — J302 Other seasonal allergic rhinitis: Secondary | ICD-10-CM | POA: Diagnosis not present

## 2022-06-11 DIAGNOSIS — R0683 Snoring: Secondary | ICD-10-CM | POA: Diagnosis not present

## 2022-06-11 DIAGNOSIS — K219 Gastro-esophageal reflux disease without esophagitis: Secondary | ICD-10-CM | POA: Diagnosis not present

## 2022-06-11 DIAGNOSIS — R070 Pain in throat: Secondary | ICD-10-CM | POA: Diagnosis not present

## 2022-08-15 DIAGNOSIS — J4520 Mild intermittent asthma, uncomplicated: Secondary | ICD-10-CM | POA: Diagnosis not present

## 2022-08-15 DIAGNOSIS — J302 Other seasonal allergic rhinitis: Secondary | ICD-10-CM | POA: Diagnosis not present

## 2022-08-15 DIAGNOSIS — H6122 Impacted cerumen, left ear: Secondary | ICD-10-CM | POA: Diagnosis not present

## 2022-08-24 IMAGING — DX DG CHEST 1V PORT
1 series · 1 of 1 positions shown · non-contrast
Comparison: December 22, 2020

CLINICAL DATA: Shortness of breath, asthma attack.

EXAM:
PORTABLE CHEST 1 VIEW

[chest ap]
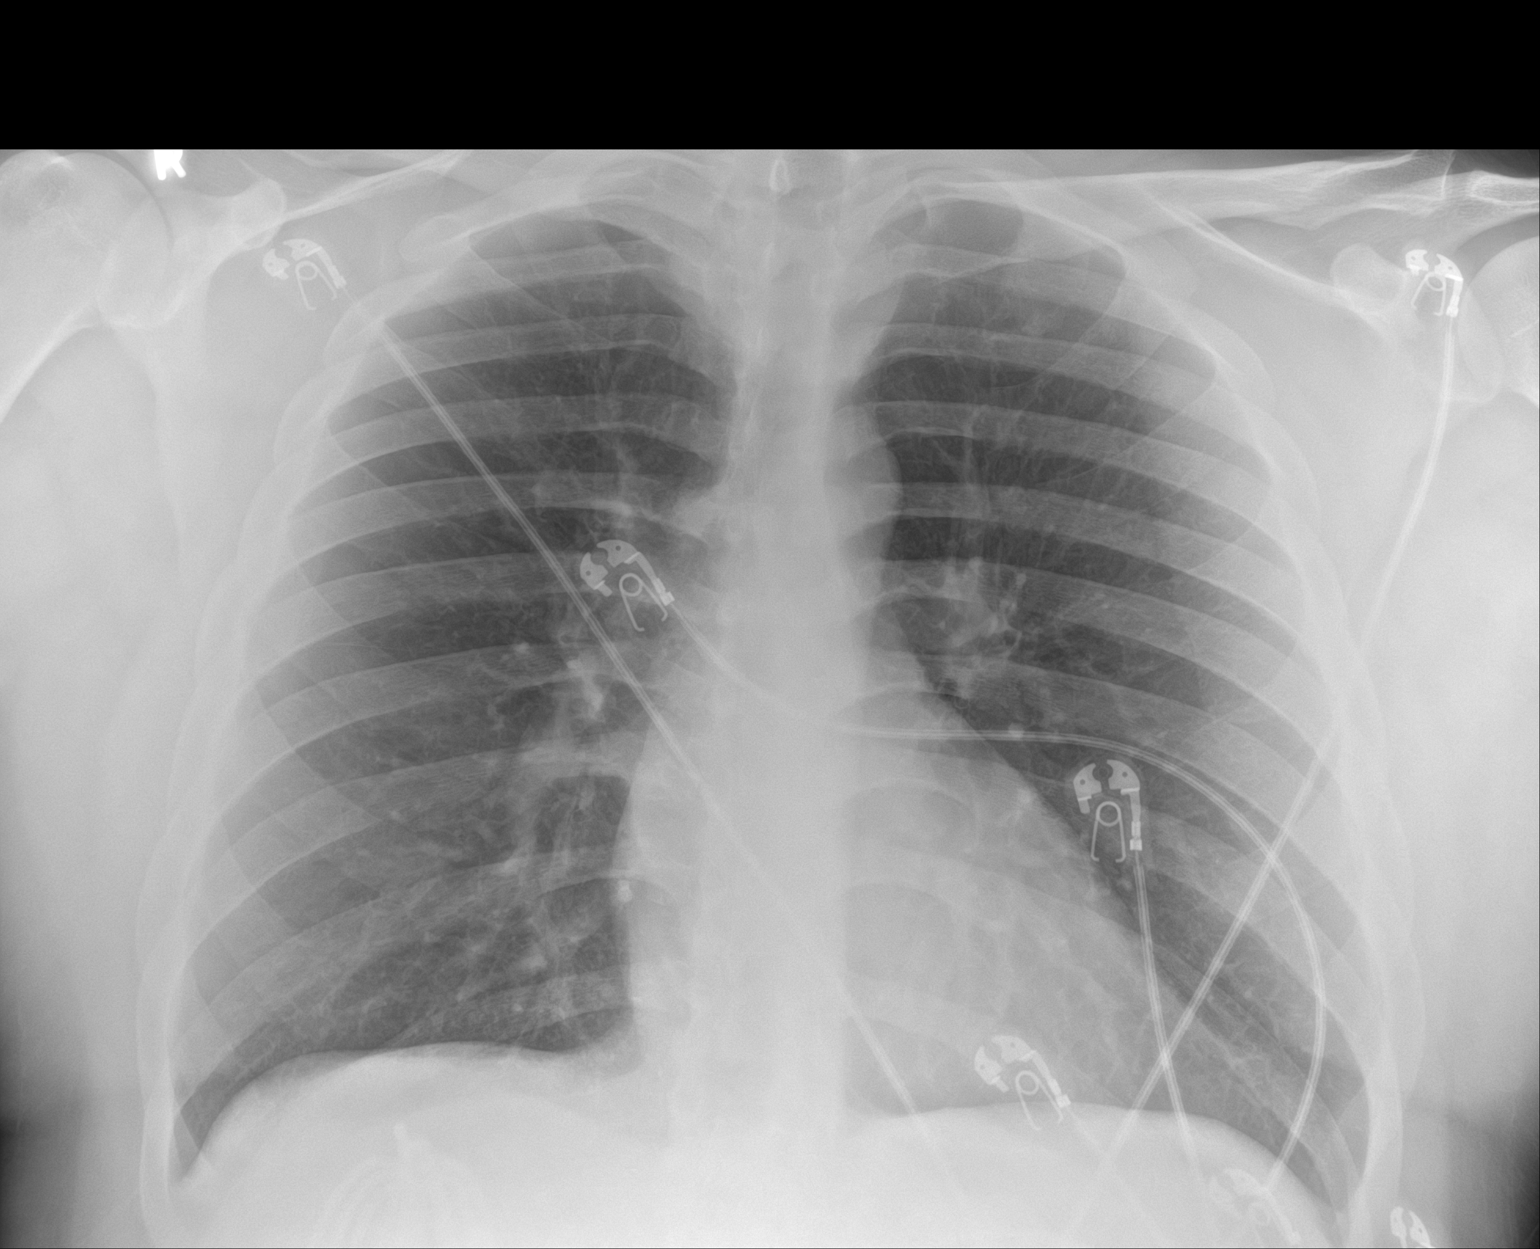

[1 of 1 positions shown; findings below may reference images not displayed]

FINDINGS: The heart size and mediastinal contours are within normal limits.
Perihilar predominant interstitial opacities suggesting reactive
airways disease in the appropriate clinical setting. No focal
airspace consolidation. The visualized skeletal structures are
unremarkable.
IMPRESSION: Perihilar predominant interstitial opacities suggesting reactive
airways disease in the appropriate clinical setting.

## 2022-09-25 DIAGNOSIS — Z1152 Encounter for screening for COVID-19: Secondary | ICD-10-CM | POA: Diagnosis not present

## 2022-09-29 DIAGNOSIS — Z1152 Encounter for screening for COVID-19: Secondary | ICD-10-CM | POA: Diagnosis not present

## 2022-10-10 DIAGNOSIS — R0683 Snoring: Secondary | ICD-10-CM | POA: Diagnosis not present

## 2022-10-10 DIAGNOSIS — I10 Essential (primary) hypertension: Secondary | ICD-10-CM | POA: Diagnosis not present

## 2022-10-10 DIAGNOSIS — K219 Gastro-esophageal reflux disease without esophagitis: Secondary | ICD-10-CM | POA: Diagnosis not present

## 2022-10-10 DIAGNOSIS — J4520 Mild intermittent asthma, uncomplicated: Secondary | ICD-10-CM | POA: Diagnosis not present

## 2022-11-06 DIAGNOSIS — K219 Gastro-esophageal reflux disease without esophagitis: Secondary | ICD-10-CM | POA: Diagnosis not present

## 2022-11-06 DIAGNOSIS — Z6841 Body Mass Index (BMI) 40.0 and over, adult: Secondary | ICD-10-CM | POA: Diagnosis not present

## 2022-11-06 DIAGNOSIS — J4531 Mild persistent asthma with (acute) exacerbation: Secondary | ICD-10-CM | POA: Diagnosis not present

## 2022-11-06 DIAGNOSIS — E669 Obesity, unspecified: Secondary | ICD-10-CM | POA: Diagnosis not present

## 2022-12-17 ENCOUNTER — Ambulatory Visit: Payer: 59 | Admitting: Adult Health

## 2022-12-17 ENCOUNTER — Encounter: Payer: Self-pay | Admitting: Adult Health

## 2023-01-29 DIAGNOSIS — J4520 Mild intermittent asthma, uncomplicated: Secondary | ICD-10-CM | POA: Diagnosis not present

## 2023-01-29 DIAGNOSIS — E6609 Other obesity due to excess calories: Secondary | ICD-10-CM | POA: Diagnosis not present

## 2023-01-29 DIAGNOSIS — I10 Essential (primary) hypertension: Secondary | ICD-10-CM | POA: Diagnosis not present

## 2023-01-29 DIAGNOSIS — E782 Mixed hyperlipidemia: Secondary | ICD-10-CM | POA: Diagnosis not present

## 2023-01-29 DIAGNOSIS — Z6841 Body Mass Index (BMI) 40.0 and over, adult: Secondary | ICD-10-CM | POA: Diagnosis not present

## 2023-01-29 DIAGNOSIS — Z113 Encounter for screening for infections with a predominantly sexual mode of transmission: Secondary | ICD-10-CM | POA: Diagnosis not present

## 2023-01-29 DIAGNOSIS — G478 Other sleep disorders: Secondary | ICD-10-CM | POA: Diagnosis not present

## 2023-01-29 DIAGNOSIS — R41840 Attention and concentration deficit: Secondary | ICD-10-CM | POA: Diagnosis not present

## 2023-01-29 DIAGNOSIS — R7303 Prediabetes: Secondary | ICD-10-CM | POA: Diagnosis not present

## 2023-07-16 DIAGNOSIS — K219 Gastro-esophageal reflux disease without esophagitis: Secondary | ICD-10-CM | POA: Diagnosis not present

## 2023-07-16 DIAGNOSIS — R030 Elevated blood-pressure reading, without diagnosis of hypertension: Secondary | ICD-10-CM | POA: Diagnosis not present

## 2023-07-16 DIAGNOSIS — J45909 Unspecified asthma, uncomplicated: Secondary | ICD-10-CM | POA: Diagnosis not present

## 2023-07-30 DIAGNOSIS — E6609 Other obesity due to excess calories: Secondary | ICD-10-CM | POA: Diagnosis not present

## 2023-07-30 DIAGNOSIS — G478 Other sleep disorders: Secondary | ICD-10-CM | POA: Diagnosis not present

## 2023-07-30 DIAGNOSIS — R030 Elevated blood-pressure reading, without diagnosis of hypertension: Secondary | ICD-10-CM | POA: Diagnosis not present
